# Patient Record
Sex: Female | Born: 1980 | Race: White | Hispanic: No | Marital: Married | Smoking: Former smoker
Health system: Southern US, Community
[De-identification: ages and names within clinical notes are randomized; demographics above are authoritative.]

## PROBLEM LIST (undated history)

## (undated) DIAGNOSIS — B009 Herpesviral infection, unspecified: Secondary | ICD-10-CM

## (undated) DIAGNOSIS — K219 Gastro-esophageal reflux disease without esophagitis: Secondary | ICD-10-CM

## (undated) DIAGNOSIS — O139 Gestational [pregnancy-induced] hypertension without significant proteinuria, unspecified trimester: Secondary | ICD-10-CM

## (undated) HISTORY — PX: COLONOSCOPY: SHX174

## (undated) HISTORY — PX: OTHER SURGICAL HISTORY: SHX169

## (undated) HISTORY — PX: TONSILLECTOMY: SUR1361

## (undated) HISTORY — PX: APPENDECTOMY: SHX54

---

## 1999-04-25 ENCOUNTER — Emergency Department (HOSPITAL_COMMUNITY): Admission: EM | Admit: 1999-04-25 | Discharge: 1999-04-25 | Payer: Self-pay | Admitting: *Deleted

## 1999-05-25 ENCOUNTER — Other Ambulatory Visit: Admission: RE | Admit: 1999-05-25 | Discharge: 1999-05-25 | Payer: Self-pay | Admitting: Obstetrics and Gynecology

## 2000-09-29 ENCOUNTER — Emergency Department (HOSPITAL_COMMUNITY): Admission: EM | Admit: 2000-09-29 | Discharge: 2000-09-29 | Payer: Self-pay | Admitting: Emergency Medicine

## 2000-11-11 ENCOUNTER — Other Ambulatory Visit: Admission: RE | Admit: 2000-11-11 | Discharge: 2000-11-11 | Payer: Self-pay | Admitting: Obstetrics and Gynecology

## 2001-02-08 ENCOUNTER — Emergency Department (HOSPITAL_COMMUNITY): Admission: EM | Admit: 2001-02-08 | Discharge: 2001-02-08 | Payer: Self-pay | Admitting: Emergency Medicine

## 2001-02-08 ENCOUNTER — Encounter: Payer: Self-pay | Admitting: Emergency Medicine

## 2001-10-22 ENCOUNTER — Emergency Department (HOSPITAL_COMMUNITY): Admission: EM | Admit: 2001-10-22 | Discharge: 2001-10-23 | Payer: Self-pay | Admitting: *Deleted

## 2001-11-18 ENCOUNTER — Other Ambulatory Visit: Admission: RE | Admit: 2001-11-18 | Discharge: 2001-11-18 | Payer: Self-pay | Admitting: Obstetrics and Gynecology

## 2002-07-16 ENCOUNTER — Other Ambulatory Visit: Admission: RE | Admit: 2002-07-16 | Discharge: 2002-07-16 | Payer: Self-pay | Admitting: Obstetrics and Gynecology

## 2002-11-05 ENCOUNTER — Inpatient Hospital Stay (HOSPITAL_COMMUNITY): Admission: AD | Admit: 2002-11-05 | Discharge: 2002-11-05 | Payer: Self-pay | Admitting: Obstetrics and Gynecology

## 2002-11-06 ENCOUNTER — Encounter: Payer: Self-pay | Admitting: Obstetrics and Gynecology

## 2003-02-07 ENCOUNTER — Inpatient Hospital Stay (HOSPITAL_COMMUNITY): Admission: AD | Admit: 2003-02-07 | Discharge: 2003-02-07 | Payer: Self-pay | Admitting: Obstetrics & Gynecology

## 2003-02-15 ENCOUNTER — Inpatient Hospital Stay (HOSPITAL_COMMUNITY): Admission: AD | Admit: 2003-02-15 | Discharge: 2003-02-18 | Payer: Self-pay | Admitting: Obstetrics and Gynecology

## 2003-02-16 ENCOUNTER — Encounter (INDEPENDENT_AMBULATORY_CARE_PROVIDER_SITE_OTHER): Payer: Self-pay | Admitting: Specialist

## 2003-02-25 ENCOUNTER — Ambulatory Visit (HOSPITAL_COMMUNITY): Admission: RE | Admit: 2003-02-25 | Discharge: 2003-02-25 | Payer: Self-pay | Admitting: Obstetrics & Gynecology

## 2003-03-22 ENCOUNTER — Other Ambulatory Visit: Admission: RE | Admit: 2003-03-22 | Discharge: 2003-03-22 | Payer: Self-pay | Admitting: Obstetrics and Gynecology

## 2007-01-08 ENCOUNTER — Emergency Department (HOSPITAL_COMMUNITY): Admission: EM | Admit: 2007-01-08 | Discharge: 2007-01-08 | Payer: Self-pay | Admitting: *Deleted

## 2009-10-18 ENCOUNTER — Encounter: Admission: RE | Admit: 2009-10-18 | Discharge: 2009-10-18 | Payer: Self-pay | Admitting: *Deleted

## 2009-11-22 ENCOUNTER — Ambulatory Visit (HOSPITAL_COMMUNITY): Admission: RE | Admit: 2009-11-22 | Discharge: 2009-11-22 | Payer: Self-pay | Admitting: Obstetrics and Gynecology

## 2010-05-17 ENCOUNTER — Emergency Department (HOSPITAL_COMMUNITY): Admission: EM | Admit: 2010-05-17 | Discharge: 2010-05-17 | Payer: Self-pay | Admitting: Family Medicine

## 2010-06-07 IMAGING — RF DG HYSTEROGRAM
6 series · 6 of 6 positions shown · IV contrast (omnipaque)
Comparison: none

CLINICAL DATA: Infertility

HYSTEROSALPINGOGRAM
TECHNIQUE: Following cleansing of the cervix and vagina with
Betadine solution, a hysterosalpingogram was performed using a 5-
French hysterosalpingogram catheter and Omnipaque 300 contrast.
The patient tolerated the exam without difficulty.
Fluoroscopy time:  0.9 minutes

[Series 1: run · 1 of 1 slices shown (1 of 6)]
[im 1/1]
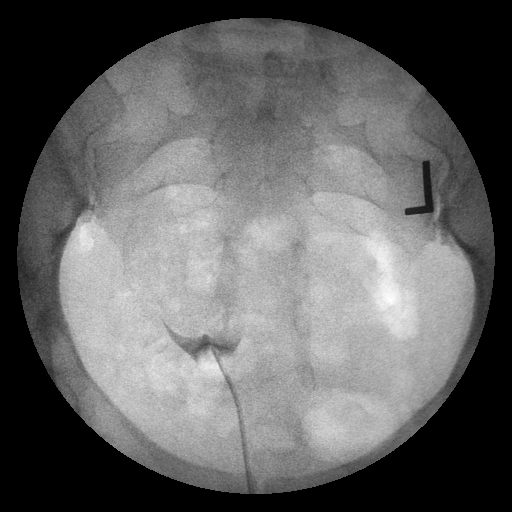

[Series 2: run · 1 of 1 slices shown (2 of 6)]
[im 1/1]
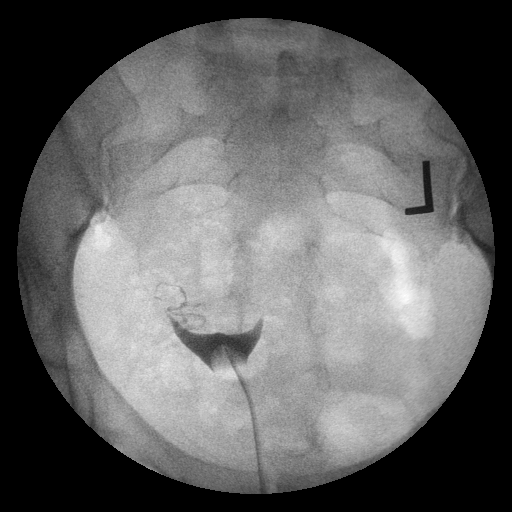

[Series 3: run · 1 of 1 slices shown (3 of 6)]
[im 1/1]
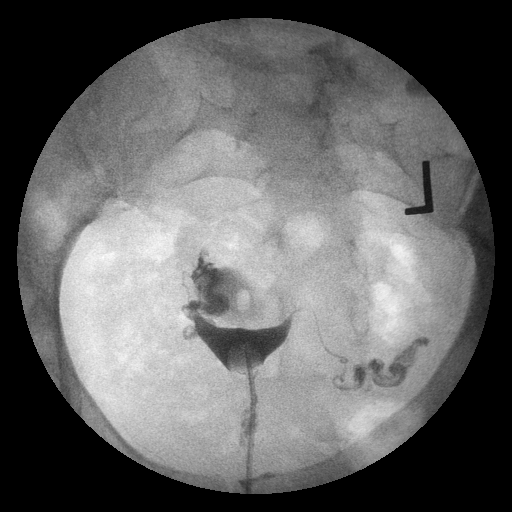

[Series 4: run · 1 of 1 slices shown (4 of 6)]
[im 1/1]
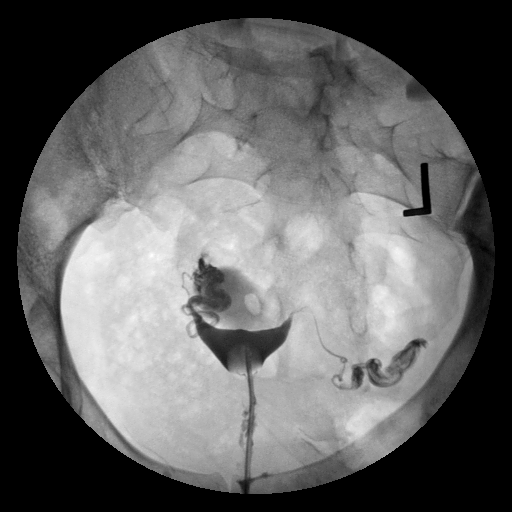

[Series 5: run · 1 of 1 slices shown (5 of 6)]
[im 1/1]
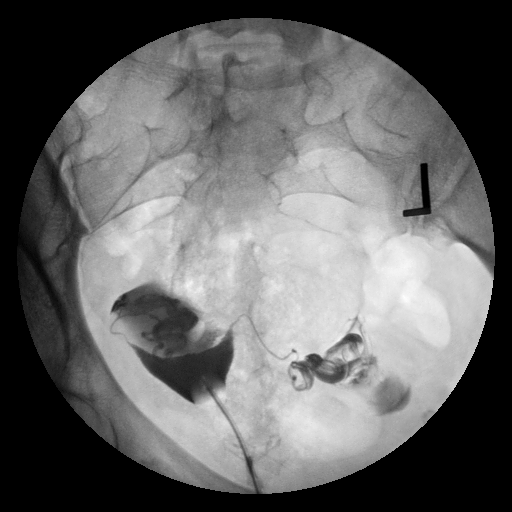

[Series 6: run · 1 of 1 slices shown (6 of 6)]
[im 1/1]
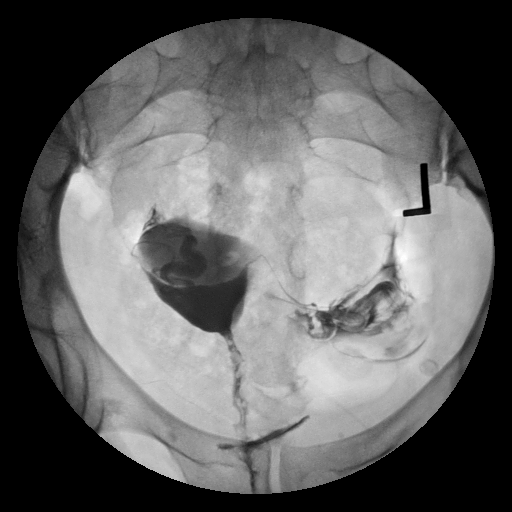

[6 of 6 positions shown; findings below may reference images not displayed]

FINDINGS: The endometrial cavity of the uterus is normal in contour
and appearance.

Contrast filling of both fallopian tubes is seen, and both tubes
are normal in appearance.  Intraperitoneal spill of contrast from
both fallopian tubes is demonstrated.
IMPRESSION: Normal study.  Fallopian tubes are patent bilaterally.

## 2010-07-22 NOTE — L&D Delivery Note (Signed)
Delivery Note At 7:10 AM a viable and healthy female was delivered via Vaginal, Spontaneous Delivery (Presentation: ; Occiput Anterior).  APGAR: 8, 9; weight 7 lb 4.6 oz (3305 g).   Placenta status: Intact, Spontaneous. To path Cord: 2 vessels with the following complications:loose CAN x 1 None.  Cord pH: none Uterine atony mgmt per cytotec per rectum  Anesthesia: Other  Episiotomy: None Lacerations: None Suture Repair: none Est. Blood Loss (mL):   Mom to postpartum.  Baby to nursery-stable.  Welford Christmas A 04/28/2011, 7:52 AM

## 2010-10-03 LAB — POCT URINALYSIS DIPSTICK
Protein, ur: 30 mg/dL — AB
Specific Gravity, Urine: <=1.005 (ref 1.005–1.030)
Urobilinogen, UA: 0.2 mg/dL (ref 0.0–1.0)
pH: 6.5 (ref 5.0–8.0)

## 2010-10-03 LAB — POCT PREGNANCY, URINE: Preg Test, Ur: NEGATIVE

## 2010-10-09 ENCOUNTER — Other Ambulatory Visit: Payer: Self-pay | Admitting: Obstetrics and Gynecology

## 2010-12-07 NOTE — Consult Note (Signed)
   NAME:  Rhonda Sutton, Rhonda Sutton                  ACCOUNT NO.:  [id number]   MEDICAL RECORD NO.:  [id number]                   PATIENT TYPE:  MAT   LOCATION:  [facility]                                 FACILITY:  [facility]   PHYSICIAN:  Lenoard Aden, M.D.             DATE OF BIRTH:  1980-12-27   DATE OF CONSULTATION:  11/05/2002  DATE OF DISCHARGE:  11/05/2002                                   CONSULTATION   CHIEF COMPLAINT:  Was bleeding post intercourse.   HISTORY OF PRESENT ILLNESS:  Patient is a 30 year old white female, G1, P0,  with previously known placenta previa who presents with vaginal bleeding.   PAST MEDICAL HISTORY:  Noncontributory.   OBSTETRIC HISTORY:  Noncontributory.   FAMILY HISTORY:  Noncontributory.   PHYSICAL EXAMINATION:  GENERAL:  She is an anxious-appearing white female in  no apparent distress.  HEENT:  Normal.  LUNGS:  Clear.  HEART:  Regular rate and rhythm.  ABDOMEN:  Soft, gravid, nontender.  PELVIC EXAM:  The speculum exam reveals no evidence of active bleeding.  NEUROLOGIC EXAM:  Nonfocal.   LABORATORY DATA:  Ultrasound consistent with marginal placenta previa,  normal fluid, and normal translabial cervical length.   IMPRESSION:  Second-trimester bleeding, questionably postcoital in nature.   PLAN:  Follow up in the office in one week, and bleeding precautions given,  pelvic rest discussed.                                               Lenoard Aden, M.D.    RJT/MEDQ  D:  11/14/2002  T:  11/14/2002  Job:  [id number]

## 2011-02-20 ENCOUNTER — Inpatient Hospital Stay (HOSPITAL_COMMUNITY): Admission: AD | Admit: 2011-02-20 | Payer: Self-pay | Source: Ambulatory Visit | Admitting: Obstetrics and Gynecology

## 2011-04-25 ENCOUNTER — Encounter (HOSPITAL_BASED_OUTPATIENT_CLINIC_OR_DEPARTMENT_OTHER): Payer: Commercial Managed Care - PPO | Admitting: Oncology

## 2011-04-25 ENCOUNTER — Other Ambulatory Visit: Payer: Self-pay | Admitting: Oncology

## 2011-04-25 ENCOUNTER — Encounter (HOSPITAL_COMMUNITY): Payer: Self-pay | Admitting: Obstetrics and Gynecology

## 2011-04-25 ENCOUNTER — Ambulatory Visit (HOSPITAL_COMMUNITY)
Admission: AD | Admit: 2011-04-25 | Discharge: 2011-04-26 | Disposition: A | Discharge status: Home / Self Care | Payer: Commercial Managed Care - PPO | Source: Ambulatory Visit | Attending: Oncology | Admitting: Oncology

## 2011-04-25 ENCOUNTER — Encounter: Payer: Self-pay | Admitting: Oncology

## 2011-04-25 DIAGNOSIS — O99119 Other diseases of the blood and blood-forming organs and certain disorders involving the immune mechanism complicating pregnancy, unspecified trimester: Principal | ICD-10-CM | POA: Insufficient documentation

## 2011-04-25 DIAGNOSIS — K219 Gastro-esophageal reflux disease without esophagitis: Secondary | ICD-10-CM | POA: Insufficient documentation

## 2011-04-25 DIAGNOSIS — O139 Gestational [pregnancy-induced] hypertension without significant proteinuria, unspecified trimester: Secondary | ICD-10-CM | POA: Insufficient documentation

## 2011-04-25 DIAGNOSIS — D689 Coagulation defect, unspecified: Secondary | ICD-10-CM

## 2011-04-25 DIAGNOSIS — D696 Thrombocytopenia, unspecified: Secondary | ICD-10-CM | POA: Insufficient documentation

## 2011-04-25 DIAGNOSIS — D6959 Other secondary thrombocytopenia: Secondary | ICD-10-CM

## 2011-04-25 HISTORY — DX: Gastro-esophageal reflux disease without esophagitis: K21.9

## 2011-04-25 HISTORY — DX: Herpesviral infection, unspecified: B00.9

## 2011-04-25 LAB — CBC WITH DIFFERENTIAL/PLATELET
BASO%: 0.3 % (ref 0.0–2.0)
Basophils Absolute: 0 10*3/uL (ref 0.0–0.1)
EOS%: 0.3 % (ref 0.0–7.0)
HCT: 36.8 % (ref 34.8–46.6)
LYMPH%: 23.1 % (ref 14.0–49.7)
MCH: 33.7 pg (ref 25.1–34.0)
MCHC: 35.4 g/dL (ref 31.5–36.0)
MCV: 95.3 fL (ref 79.5–101.0)
MONO%: 5.1 % (ref 0.0–14.0)
NEUT%: 71.2 % (ref 38.4–76.8)
Platelets: 87 10*3/uL — ABNORMAL LOW (ref 145–400)
lymph#: 1.7 10*3/uL (ref 0.9–3.3)

## 2011-04-25 LAB — COMPREHENSIVE METABOLIC PANEL
ALT: 30 U/L (ref 0–35)
AST: 33 U/L (ref 0–37)
Alkaline Phosphatase: 166 U/L — ABNORMAL HIGH (ref 39–117)
BUN: 11 mg/dL (ref 6–23)
Creatinine, Ser: 0.59 mg/dL (ref 0.50–1.10)
Total Bilirubin: 0.1 mg/dL — ABNORMAL LOW (ref 0.3–1.2)

## 2011-04-25 MED ORDER — DEXTROSE 5 % IV SOLN
INTRAVENOUS | Status: DC
  Administered 2011-04-25: 20:00:00 via INTRAVENOUS

## 2011-04-25 MED ORDER — METHYLPREDNISOLONE SODIUM SUCC 40 MG IJ SOLR
20.0000 mg | Freq: Once | INTRAMUSCULAR | Status: AC
  Administered 2011-04-25: 20 mg via INTRAVENOUS
  Filled 2011-04-25: qty 0.5

## 2011-04-25 MED ORDER — SODIUM CHLORIDE 0.9 % IV SOLN: 25.0000 mg | Freq: Once | INTRAVENOUS | Status: DC

## 2011-04-25 MED ORDER — SODIUM CHLORIDE 0.9 % IV SOLN: 25.0000 mg/h | Freq: Once | INTRAVENOUS | Status: DC

## 2011-04-25 MED ORDER — IMMUNE GLOBULIN (HUMAN) 20 GM/200ML IV SOLN
80.0000 g | Freq: Once | INTRAVENOUS | Status: AC
  Administered 2011-04-25: 40 g via INTRAVENOUS
  Filled 2011-04-25: qty 800

## 2011-04-25 MED ORDER — MEPERIDINE HCL 25 MG/ML IJ SOLN
25.0000 mg | Freq: Once | INTRAMUSCULAR | Status: AC
  Administered 2011-04-25: 25 mg via INTRAVENOUS
  Filled 2011-04-25: qty 1

## 2011-04-25 MED ORDER — DIPHENHYDRAMINE HCL 25 MG PO CAPS
25.0000 mg | ORAL_CAPSULE | Freq: Once | ORAL | Status: AC
  Administered 2011-04-25: 25 mg via ORAL
  Filled 2011-04-25: qty 1

## 2011-04-25 MED ORDER — ACETAMINOPHEN 325 MG PO TABS
650.0000 mg | ORAL_TABLET | Freq: Once | ORAL | Status: AC
  Administered 2011-04-25: 650 mg via ORAL
  Filled 2011-04-25: qty 2

## 2011-04-25 NOTE — Progress Notes (Deleted)
2021 - IVIG started

## 2011-04-25 NOTE — H&P (Signed)
Rhonda Sutton is a 30 y.o. female G2P1001 presenting for IV IG 2nd to plt count of 88,000. PIH labs normal.(office) Prev preg complicated by gestational thrombocytopenia.  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 Family History: family history is not on file. Social History:  does not have a smoking history on file. She does not have any smokeless tobacco history on file. Her alcohol and drug histories not on file.  ROS: denies h/a, visual change or epigastric pain  Negative General ROS: negative Genito-Urinary ROS: negative   PE: WDWN WF in NAD VS: BP 147/91 P103 afebrile Skin: no lesion HEENT: anicteric sclera, pink conjunctiva, oropharynx neg Cor: RRR w/o m Lungs clear to A Abd: gravid nontender Pelvic deferred  30 y.o.   Prenatal labs: ABO, Rh:  O positive Antibody:  neg Rubella:  Immune RPR:   NR HBsAg:   neg HIV:   neg GBS:   neg  Assessment/Plan: Gestational thrombocytopenia IUP @ 37 wk.  P) IV IG per Rhonda Sutton. Cont fetal monitoring 3)d/c home after infusion. Repeat plt count next Tuesday.  f/u heme/onc Friday  Rhonda Sutton A 04/25/2011, 6:39 PM

## 2011-04-25 NOTE — Progress Notes (Addendum)
Pt. Resting comfortably in bed. 20g PIV started in left FA and D5W infusing. Husband at bedside. Pt. Pre-medicated as ordered with p.o. Tylenol and Benadryl.  2021 - IVIG started at 50mg /kg/hr (36.5cc/hr) 2037 - IVIG increased to 100mg /kg/hr ( 73cc/hr) 2053 - IVIG increased to 200mg /kg/hr (146cc/hr) 2110 - IVIG increased to 400mg /kg/hr (292cc/hr)  Pt. Tolerated all titrations well, no adverse reactions, VSS and pt. Denies any pain or discomfort at this time. Mild HA that she arrived with (states she has "had for days") remains the same and has not increased intensity or location

## 2011-04-25 NOTE — Progress Notes (Signed)
2150 - Pt. Began to "shake" Pt. And S.O. provided explanation about "rigors" and IVIG infusion decreased by half to 200mg /kg/hr (146cc/hr) as per protocol. Rigors continued with no improvement, so within 2-15mins Infusion was stopped and Dr. Arline Asp (on call for Dr. Park Breed) was notified and orders received for Demerol 25mg  IV x1 and Solumedrol 20mg  IV x 1 and then if rigors resolve attempt to transfuse remainder of dose/bottle currently infusing (2 of 4), approx. 10g at reduced rate if pt. Tolerates.  Dr. Cherly Hensen then notified of status of pt. and orders received by Dr. Arline Asp and she agreed with plan of care. Remaining 40g of IVIG ( half of the original ordered dose) will be on hold until a.m.  Dr. Cherly Hensen states she will discuss continued plan of care with Dr. Park Breed in the morning. Pt. Will remain in AICU and be monitored throughout the night.  2310 - IVIG restarted at 50mg /kg/hr (36.5 cc/hr) 2340 - pt. Tolerating well and rate increased to 100mg /kg/hr (73cc/hr)

## 2011-04-26 ENCOUNTER — Encounter (HOSPITAL_COMMUNITY): Payer: Self-pay | Admitting: *Deleted

## 2011-04-26 MED ORDER — IMMUNE GLOBULIN (HUMAN) 20 GM/200ML IV SOLN
40.0000 g | Freq: Once | INTRAVENOUS | Status: AC
  Administered 2011-04-26: 40 g via INTRAVENOUS
  Administered 2011-04-26: 20 g via INTRAVENOUS
  Filled 2011-04-26: qty 400

## 2011-04-26 MED ORDER — ACETAMINOPHEN 325 MG PO TABS
650.0000 mg | ORAL_TABLET | Freq: Once | ORAL | Status: AC
  Administered 2011-04-26: 650 mg via ORAL
  Filled 2011-04-26: qty 2

## 2011-04-26 MED ORDER — DIPHENHYDRAMINE HCL 50 MG/ML IJ SOLN
50.0000 mg | Freq: Once | INTRAMUSCULAR | Status: AC
  Administered 2011-04-26: 50 mg via INTRAVENOUS
  Filled 2011-04-26: qty 1

## 2011-04-26 MED ORDER — METHYLPREDNISOLONE SODIUM SUCC 125 MG IJ SOLR
125.0000 mg | Freq: Once | INTRAMUSCULAR | Status: AC
  Administered 2011-04-26: 125 mg via INTRAVENOUS
  Filled 2011-04-26: qty 2

## 2011-04-26 NOTE — Progress Notes (Addendum)
Spoke with Dr. Welton Flakes this am 0825. Orders received to restart IVIG to complete the transfusion that was stopped last evening as a result of reacting to transfusion.  Pt. received 40grams last evening and is to received an additional 40grams this am. Pre-medication given per orders. Both pt and husband verbalized understanding of plan of care.Tia Alert A  0920-20gms IVIG hung and started at 36.5cc/hr 0935-rarte increased to 73 cc/hr-pt. Tolerating well 0950-rate increased to 146cc/hr-continues to tolerate well. Tia Alert A   1050-20gms IVIG hung (4th and final dose)-keeping at 146 cc/hr-pt without complaints Tia Alert A

## 2011-04-26 NOTE — Progress Notes (Signed)
  S: feels well. Had ctx last night which was not perceived by pt (+) FM Pt had reaction to IVIG last night  O: VSS BP 128/92 Abd: soft gravid nontender Pelvic deferred Extr: (-)edema  Tracing reactive baseline 140 (-)ctx  PIH labs nl in office   A/P Gest thrombocytopenia  PIH IUP # 37 + wk gestation  P) cont IV IG per Dr. Welton Flakes  D/c home after dosings. Repeat plt ct Tues( sched) Labor prec Ob appt next Fri along w/ heme/onc appt

## 2011-04-26 NOTE — Progress Notes (Signed)
1220-IVIG infusion complete-VSS. Pt without complaints. Tia Alert A

## 2011-04-28 ENCOUNTER — Other Ambulatory Visit: Payer: Self-pay | Admitting: Obstetrics and Gynecology

## 2011-04-28 ENCOUNTER — Encounter (HOSPITAL_COMMUNITY): Payer: Self-pay | Admitting: *Deleted

## 2011-04-28 ENCOUNTER — Inpatient Hospital Stay (HOSPITAL_COMMUNITY)
Admission: AD | Admit: 2011-04-28 | Discharge: 2011-05-01 | DRG: 774 | Disposition: A | Discharge status: Home / Self Care | Payer: Commercial Managed Care - PPO | Source: Ambulatory Visit | Attending: Obstetrics & Gynecology | Admitting: Obstetrics & Gynecology

## 2011-04-28 DIAGNOSIS — O139 Gestational [pregnancy-induced] hypertension without significant proteinuria, unspecified trimester: Secondary | ICD-10-CM | POA: Diagnosis present

## 2011-04-28 DIAGNOSIS — O1424 HELLP syndrome, complicating childbirth: Secondary | ICD-10-CM

## 2011-04-28 DIAGNOSIS — D689 Coagulation defect, unspecified: Principal | ICD-10-CM | POA: Diagnosis present

## 2011-04-28 DIAGNOSIS — D696 Thrombocytopenia, unspecified: Secondary | ICD-10-CM | POA: Diagnosis present

## 2011-04-28 HISTORY — DX: Gestational (pregnancy-induced) hypertension without significant proteinuria, unspecified trimester: O13.9

## 2011-04-28 LAB — HIV ANTIBODY (ROUTINE TESTING W REFLEX): HIV: NONREACTIVE

## 2011-04-28 LAB — ABO/RH: ABO/RH(D): O POS

## 2011-04-28 LAB — CBC
MCHC: 34 g/dL (ref 30.0–36.0)
MCV: 95.1 fL (ref 78.0–100.0)
Platelets: 82 10*3/uL — ABNORMAL LOW (ref 150–400)
RDW: 13.5 % (ref 11.5–15.5)
WBC: 7.3 10*3/uL (ref 4.0–10.5)

## 2011-04-28 LAB — POCT FERN TEST: Fern Test: POSITIVE

## 2011-04-28 LAB — HEPATITIS B SURFACE ANTIGEN: Hepatitis B Surface Ag: NEGATIVE

## 2011-04-28 MED ORDER — ACYCLOVIR 200 MG PO CAPS
400.0000 mg | ORAL_CAPSULE | Freq: Two times a day (BID) | ORAL | Status: DC
  Administered 2011-04-28 (×2): 400 mg via ORAL
  Filled 2011-04-28 (×3): qty 2

## 2011-04-28 MED ORDER — DIBUCAINE 1 % RE OINT: 1.0000 "application " | TOPICAL_OINTMENT | RECTAL | Status: DC | PRN

## 2011-04-28 MED ORDER — NALOXONE HCL 0.4 MG/ML IJ SOLN: 0.4000 mg | INTRAMUSCULAR | Status: DC | PRN

## 2011-04-28 MED ORDER — BENZOCAINE-MENTHOL 20-0.5 % EX AERO
INHALATION_SPRAY | CUTANEOUS | Status: AC
  Administered 2011-04-28: 1 via TOPICAL
  Filled 2011-04-28: qty 56

## 2011-04-28 MED ORDER — LACTATED RINGERS IV SOLN: INTRAVENOUS | Status: DC

## 2011-04-28 MED ORDER — OXYCODONE-ACETAMINOPHEN 5-325 MG PO TABS
2.0000 | ORAL_TABLET | ORAL | Status: DC | PRN
  Administered 2011-04-28: 1 via ORAL
  Filled 2011-04-28: qty 1

## 2011-04-28 MED ORDER — FENTANYL 10 MCG/ML IV SOLN
INTRAVENOUS | Status: DC
  Administered 2011-04-28: 20 ug via INTRAVENOUS
  Filled 2011-04-28: qty 50

## 2011-04-28 MED ORDER — OXYTOCIN BOLUS FROM INFUSION
500.0000 mL | Freq: Once | INTRAVENOUS | Status: DC
  Filled 2011-04-28: qty 500
  Filled 2011-04-28: qty 1000

## 2011-04-28 MED ORDER — DIPHENHYDRAMINE HCL 25 MG PO CAPS: 25.0000 mg | ORAL_CAPSULE | Freq: Four times a day (QID) | ORAL | Status: DC | PRN

## 2011-04-28 MED ORDER — OXYTOCIN 10 UNIT/ML IJ SOLN
40.0000 [IU] | INTRAVENOUS | Status: DC
  Administered 2011-04-28: 40 [IU] via INTRAVENOUS
  Filled 2011-04-28: qty 4

## 2011-04-28 MED ORDER — ZOLPIDEM TARTRATE 5 MG PO TABS: 5.0000 mg | ORAL_TABLET | Freq: Every evening | ORAL | Status: DC | PRN

## 2011-04-28 MED ORDER — PANTOPRAZOLE SODIUM 40 MG PO TBEC
40.0000 mg | DELAYED_RELEASE_TABLET | Freq: Every day | ORAL | Status: DC
  Administered 2011-04-28 – 2011-04-30 (×3): 40 mg via ORAL
  Filled 2011-04-28 (×4): qty 1

## 2011-04-28 MED ORDER — ACETAMINOPHEN 325 MG PO TABS: 650.0000 mg | ORAL_TABLET | ORAL | Status: DC | PRN

## 2011-04-28 MED ORDER — ONDANSETRON HCL 4 MG PO TABS: 4.0000 mg | ORAL_TABLET | ORAL | Status: DC | PRN

## 2011-04-28 MED ORDER — FERROUS SULFATE 325 (65 FE) MG PO TABS
325.0000 mg | ORAL_TABLET | Freq: Two times a day (BID) | ORAL | Status: DC
  Administered 2011-04-28 – 2011-05-01 (×3): 325 mg via ORAL
  Filled 2011-04-28 (×3): qty 1

## 2011-04-28 MED ORDER — LIDOCAINE HCL (PF) 1 % IJ SOLN
30.0000 mL | INTRAMUSCULAR | Status: DC | PRN
  Filled 2011-04-28 (×2): qty 30

## 2011-04-28 MED ORDER — DIPHENHYDRAMINE HCL 50 MG/ML IJ SOLN: 12.5000 mg | Freq: Four times a day (QID) | INTRAMUSCULAR | Status: DC | PRN

## 2011-04-28 MED ORDER — ACYCLOVIR 800 MG PO TABS
400.0000 mg | ORAL_TABLET | Freq: Two times a day (BID) | ORAL | Status: DC
  Filled 2011-04-28 (×3): qty 1

## 2011-04-28 MED ORDER — OXYCODONE-ACETAMINOPHEN 5-325 MG PO TABS
1.0000 | ORAL_TABLET | ORAL | Status: DC | PRN
Start: 2011-04-28 — End: 2011-05-01
  Administered 2011-04-28 – 2011-04-30 (×3): 1 via ORAL
  Filled 2011-04-28 (×3): qty 1

## 2011-04-28 MED ORDER — PRENATAL PLUS 27-1 MG PO TABS
1.0000 | ORAL_TABLET | Freq: Every day | ORAL | Status: DC
  Administered 2011-04-28 – 2011-05-01 (×3): 1 via ORAL
  Filled 2011-04-28 (×3): qty 1

## 2011-04-28 MED ORDER — DIPHENHYDRAMINE HCL 12.5 MG/5ML PO ELIX
12.5000 mg | ORAL_SOLUTION | Freq: Four times a day (QID) | ORAL | Status: DC | PRN
  Filled 2011-04-28: qty 5

## 2011-04-28 MED ORDER — ONDANSETRON HCL 4 MG/2ML IJ SOLN: 4.0000 mg | Freq: Four times a day (QID) | INTRAMUSCULAR | Status: DC | PRN

## 2011-04-28 MED ORDER — BENZOCAINE-MENTHOL 20-0.5 % EX AERO
1.0000 "application " | INHALATION_SPRAY | CUTANEOUS | Status: DC | PRN
  Administered 2011-04-28: 1 via TOPICAL

## 2011-04-28 MED ORDER — OXYTOCIN 20 UNITS IN LACTATED RINGERS INFUSION - SIMPLE: 125.0000 mL/h | Freq: Once | INTRAVENOUS | Status: DC

## 2011-04-28 MED ORDER — CITRIC ACID-SODIUM CITRATE 334-500 MG/5ML PO SOLN: 30.0000 mL | ORAL | Status: DC | PRN

## 2011-04-28 MED ORDER — PREDNISONE 50 MG PO TABS
60.0000 mg | ORAL_TABLET | Freq: Every day | ORAL | Status: DC
  Administered 2011-04-28 – 2011-05-01 (×3): 60 mg via ORAL
  Filled 2011-04-28 (×5): qty 1

## 2011-04-28 MED ORDER — SODIUM CHLORIDE 0.9 % IJ SOLN: 9.0000 mL | INTRAMUSCULAR | Status: DC | PRN

## 2011-04-28 MED ORDER — LABETALOL HCL 5 MG/ML IV SOLN: 20.0000 mg | Freq: Once | INTRAVENOUS | Status: DC

## 2011-04-28 MED ORDER — SENNOSIDES-DOCUSATE SODIUM 8.6-50 MG PO TABS
2.0000 | ORAL_TABLET | Freq: Every day | ORAL | Status: DC
  Administered 2011-04-28 – 2011-04-30 (×3): 2 via ORAL

## 2011-04-28 MED ORDER — IBUPROFEN 600 MG PO TABS: 600.0000 mg | ORAL_TABLET | Freq: Four times a day (QID) | ORAL | Status: DC | PRN

## 2011-04-28 MED ORDER — LACTATED RINGERS IV SOLN: 500.0000 mL | INTRAVENOUS | Status: DC | PRN

## 2011-04-28 MED ORDER — TETANUS-DIPHTH-ACELL PERTUSSIS 5-2.5-18.5 LF-MCG/0.5 IM SUSP
0.5000 mL | Freq: Once | INTRAMUSCULAR | Status: AC
  Administered 2011-04-29: 0.5 mL via INTRAMUSCULAR
  Filled 2011-04-28: qty 0.5

## 2011-04-28 MED ORDER — SIMETHICONE 80 MG PO CHEW: 80.0000 mg | CHEWABLE_TABLET | ORAL | Status: DC | PRN

## 2011-04-28 MED ORDER — LANOLIN HYDROUS EX OINT: TOPICAL_OINTMENT | CUTANEOUS | Status: DC | PRN

## 2011-04-28 MED ORDER — ONDANSETRON HCL 4 MG/2ML IJ SOLN: 4.0000 mg | INTRAMUSCULAR | Status: DC | PRN

## 2011-04-28 MED ORDER — WITCH HAZEL-GLYCERIN EX PADS: 1.0000 "application " | MEDICATED_PAD | CUTANEOUS | Status: DC | PRN

## 2011-04-28 MED ORDER — FLEET ENEMA 7-19 GM/118ML RE ENEM: 1.0000 | ENEMA | RECTAL | Status: DC | PRN

## 2011-04-28 MED ORDER — MISOPROSTOL 200 MCG PO TABS
ORAL_TABLET | ORAL | Status: AC
  Administered 2011-04-28: 800 ug
  Filled 2011-04-28: qty 5

## 2011-04-28 NOTE — Progress Notes (Signed)
SVD viable female infant.

## 2011-04-28 NOTE — H&P (Signed)
Rhonda Sutton is a 30 y.o. female presenting now @ 37 6/7  for admission 2nd to SROM clear fluid @ 2 am. [facility] complicated by gestational thrombocytopenia for which the pt was admitted for IV IG and steroid. Pt also is on HSV suppressive therapy w/o recent outbreak. Hx PPH. PIH w/ normal PIH labs last week  History OB History    Grav Para Term Preterm Abortions TAB SAB Ect Mult Living   2 1 1       1      Past Medical History  Diagnosis Date  . Gastroesophageal reflux   . Recurrent HSV (herpes simplex virus)   . Ulcerative colitis   . Migraine   . Pregnancy induced hypertension    Past Surgical History  Procedure Date  . None   . Tonsillectomy    Family History: family history is not on file. Social History:  reports that she quit smoking about 4 years ago. Her smoking use included Cigarettes. She smoked .5 packs per day. She has never used smokeless tobacco. She reports that she does not drink alcohol or use illicit drugs.  ROS neg Dilation: 2 Effacement (%): 80 Station: -2 Exam by:: DuPont, RN  Blood pressure 128/89, pulse 73, temperature 98 F (36.7 C), temperature source Oral, resp. rate 22, height 5\' 7"  (1.702 m), weight 162 lb 12.8 oz (73.846 kg), last menstrual period 07/27/2010, SpO2 98.00%. Maternal Exam:  Uterine Assessment: Contraction strength is moderate.  Contraction frequency is irregular.   Abdomen: Patient reports no abdominal tenderness. Estimated fetal weight is 7 1/2 lb.   Fetal presentation: vertex  Introitus: Normal vulva. Normal vagina.  Ferning test: positive.  Amniotic fluid character: clear.  Pelvis: adequate for delivery.      Physical Exam  Constitutional: She is oriented to person, place, and time. She appears well-developed and well-nourished.  Neck: Neck supple.  Cardiovascular: Regular rhythm.   Respiratory: Breath sounds normal.  GI: Soft.  Neurological: She is alert and oriented to person, place, and time.    Prenatal  labs: ABO, Rh: O/Positive/-- (10/07 0000) Antibody: Negative (10/07 0000) Rubella: Immune (10/07 0000) RPR: Nonreactive (10/07 0000)  HBsAg: Negative (10/07 0000)  HIV: Non-reactive (10/07 0000)  GBS: Negative (10/07 0000)   Assessment/Plan:SROM Gestational thrombocytopenia IUP @ 37 6/7wk PIH Hx PPH  P) Admit. Routine labs. Pitocin augmentation prn. Fentanyl PCA if unable to receive epidural. cont prednisone for low plt( taper per heme/onc).  BP med prn. Aggressive mgmt of post placenta delivery 2nd to prior hx    Rhonda Sutton A 04/28/2011, 7:25 AM

## 2011-04-28 NOTE — Progress Notes (Signed)
Leaking fld since 0200-cloudy color. Had been have ctxs but stronger since leaking started. Hx G2P1 at 37.6wks. Low platelets and received IVIG Thurs. Platelets 87 on Thurs before IVIG.

## 2011-04-28 NOTE — Progress Notes (Signed)
Rhonda Sutton is a 30 y.o. G2P2002 at [redacted]w[redacted]d by ultrasound admitted for rupture of membranes  Subjective: c/o rectal pressure. Fentanyl PCA for pain mgmt  Objective: BP 134/93  Pulse 69  Temp(Src) 98 F (36.7 C) (Oral)  Resp 22  Ht 5\' 7"  (1.702 m)  Wt 162 lb 12.8 oz (73.846 kg)  BMI 25.50 kg/m2  SpO2 100%  LMP 07/27/2010  Breastfeeding? Unknown      FHT:  FHR: 130 bpm, variability: moderate,  accelerations:  Present,  decelerations:  Present some variable w/ pushing UC:  Ctx q 2 mins SVE:   Dilation: 10 Effacement (%): 100 Station: +2 Exam by:: DuPont, RN  Labs: Lab Results  Component Value Date   WBC 7.3 04/28/2011   HGB 13.1 04/28/2011   HCT 38.5 04/28/2011   MCV 95.1 04/28/2011   PLT 82* 04/28/2011    Assessment / Plan: Spontaneous labor, progressing normally 2) gestational thrombocytopenia 3) PIH 4) IUP @ 37 6/7 wk  Labor: rapid progression Preeclampsia:  no signs or symptoms of toxicity Fetal Wellbeing:  Category I and Category II Pain Control:  Fentanyl I/D:  n/a Anticipated MOD:  NSVD  Rhonda Sutton A 04/28/2011, 7:45 AM

## 2011-04-28 NOTE — Progress Notes (Signed)
Dr. Cherly Hensen made aware of pt blood pressures. Order for Iv labetalol 20 mg once. No other orders at this time.

## 2011-04-28 NOTE — ED Notes (Signed)
Report called to [facility] RN in [place]. Pt to 163 from Triage

## 2011-04-29 DIAGNOSIS — O1424 HELLP syndrome, complicating childbirth: Secondary | ICD-10-CM | POA: Diagnosis present

## 2011-04-29 LAB — COMPREHENSIVE METABOLIC PANEL
Alkaline Phosphatase: 130 U/L — ABNORMAL HIGH (ref 39–117)
BUN: 10 mg/dL (ref 6–23)
CO2: 27 mEq/L (ref 19–32)
Chloride: 101 mEq/L (ref 96–112)
GFR calc Af Amer: >90 mL/min (ref >90)
Glucose, Bld: 81 mg/dL (ref 70–99)
Sodium: 133 mEq/L — ABNORMAL LOW (ref 135–145)
Total Bilirubin: 0.2 mg/dL — ABNORMAL LOW (ref 0.3–1.2)
Total Protein: 5.7 g/dL — ABNORMAL LOW (ref 6.0–8.3)

## 2011-04-29 LAB — CBC
Hemoglobin: 10.9 g/dL — ABNORMAL LOW (ref 12.0–15.0)
MCH: 32.1 pg (ref 26.0–34.0)
MCHC: 34.3 g/dL (ref 30.0–36.0)
Platelets: 64 10*3/uL — ABNORMAL LOW (ref 150–400)
RBC: 3.4 MIL/uL — ABNORMAL LOW (ref 3.87–5.11)

## 2011-04-29 LAB — URIC ACID: Uric Acid, Serum: 4.8 mg/dL (ref 2.4–7.0)

## 2011-04-29 LAB — MRSA PCR SCREENING: MRSA by PCR: NEGATIVE

## 2011-04-29 MED ORDER — NIFEDIPINE ER 30 MG PO TB24: 30.0000 mg | ORAL_TABLET | Freq: Every day | ORAL | Status: DC

## 2011-04-29 MED ORDER — MAGNESIUM SULFATE 40 G IN LACTATED RINGERS - SIMPLE
1.0000 g/h | INTRAVENOUS | Status: AC
  Administered 2011-04-29 – 2011-04-30 (×2): 2 g/h via INTRAVENOUS
  Filled 2011-04-29 (×2): qty 500

## 2011-04-29 MED ORDER — HYDROCHLOROTHIAZIDE 25 MG PO TABS
25.0000 mg | ORAL_TABLET | Freq: Every day | ORAL | Status: DC
  Administered 2011-04-29: 25 mg via ORAL
  Filled 2011-04-29 (×2): qty 1

## 2011-04-29 MED ORDER — LACTATED RINGERS IV SOLN
INTRAVENOUS | Status: DC
  Administered 2011-04-29 – 2011-04-30 (×2): via INTRAVENOUS

## 2011-04-29 MED ORDER — MAGNESIUM SULFATE BOLUS VIA INFUSION
4.0000 g | Freq: Once | INTRAVENOUS | Status: AC
  Administered 2011-04-29: 4 g via INTRAVENOUS
  Filled 2011-04-29: qty 500

## 2011-04-29 NOTE — Progress Notes (Signed)
1110 Pt rec'd from [facility] via w/c for IV magnesium for HELLP

## 2011-04-29 NOTE — Progress Notes (Signed)
UR chart review completed.  

## 2011-04-29 NOTE — Progress Notes (Signed)
PPD 1 SVD  Thrombocytopenia  S:  Reports feeling ok / headache earlier but resolved with sleep             Tolerating po/ No nausea or vomiting             Bleeding is moderate             Pain controlled withnarcotic analgesics including percocet             Up ad lib / ambulatory  Newborn breast feeding     O:  A & O x 3 NAD             VS: Blood pressure 133/95, pulse 66, temperature 98.5 F (36.9 C), temperature source Oral, resp. rate 18, height 5\' 7"  (1.702 m), weight 73.846 kg (162 lb 12.8 oz), last menstrual period 07/27/2010, SpO2 98.00%, unknown if currently breastfeeding.  LABS: Lab Results  Component Value Date   WBC 9.0 04/29/2011   HGB 10.9* 04/29/2011   HCT 31.8* 04/29/2011   MCV 93.5 04/29/2011   PLT 64* 04/29/2011    Creatnine :  0.6 Uric Acid : 4.8 SGOT : 94 SGPT: 85  Glucose: 82  Thrombocytopenia managed by Dr Welton Flakes.    I&O:   Not done     Lungs: Clear and unlabored  Heart: regular rate and rhythm / no mumurs  Abdomen: soft, non-tender, non-distended              Fundus: firm, non-tender, U-1  Perineum: no edema  Lochia: light  Extremities: 1+ edema, no calf pain or tenderness, DTR 2+   A: PPD # 1             Gestational Thrombocytopenia - Platelets at 45 today / managed by Dr. Welton Flakes  Abnormal liver enzymes / elevated BP              Preeclampsia with HELLP  P:  Routine post partum orders  Consult with DR Wonda Olds                   1) Transfer to AICU                   2) strict I & O with foley                   3) Magnesium sulfate 4gm load then 2 gm / hr                   4)  Procardia 30 mg XL daily with HCTZ 25 mg daily                   5)  call for inpatient consult by Dr Mechele Collin 04/29/2011, 9:19 AM

## 2011-04-29 NOTE — Plan of Care (Signed)
TC to Dr Welton Flakes - spoke with her directly at 1300 / calling for Dr Cherly Hensen  Updated MD with current status -s/p SVD without postpartum hemorrhage. 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 Current platelet count 64 - taking prednisone 60 mg daily / no NSAIDS. Patient transferred to AICU at [facility] for management of HELLP / preeclampsia with magnesium sulfate.  Recommendation: 1) continue prednisone 60 mg until normal platelet count 2) see hematology for taper of prednisone in next 1-2 weeks -AFTER platelet level normal range 3) call if platelet count falls less than 60 or if any further questions or concerns

## 2011-04-30 LAB — COMPREHENSIVE METABOLIC PANEL
ALT: 85 U/L — ABNORMAL HIGH (ref 0–35)
AST: 79 U/L — ABNORMAL HIGH (ref 0–37)
Albumin: 2.4 g/dL — ABNORMAL LOW (ref 3.5–5.2)
Calcium: 6.8 mg/dL — ABNORMAL LOW (ref 8.4–10.5)
Sodium: 133 mEq/L — ABNORMAL LOW (ref 135–145)
Total Protein: 6 g/dL (ref 6.0–8.3)

## 2011-04-30 LAB — CBC
MCH: 31.9 pg (ref 26.0–34.0)
MCHC: 34 g/dL (ref 30.0–36.0)
Platelets: 89 10*3/uL — ABNORMAL LOW (ref 150–400)
RBC: 3.32 MIL/uL — ABNORMAL LOW (ref 3.87–5.11)
RDW: 13.6 % (ref 11.5–15.5)

## 2011-04-30 NOTE — Progress Notes (Signed)
  Post Partum Day #2       AICU   Newborn info:  Rhonda, Sutton [[id number]]  female   / circumcision done  / feeding breast  Subjective:   Pt reports feeling tired and blurry vision, difficulty focusing long distance. On Mag Sulfate for past 20 hours.  / Tolerating po/ Voiding without problems/ No n/v/ Bleeding is light/ Pain controlled withprescription NSAID's including ibuprofen (Motrin) Infant cluster feeding and crying through the night, had only one hour's rest with infant in nursery. Does not want to supplement. Lactation support yesterday for latching issues.        Objective:     VS:   Temp:  [97.5 F (36.4 C)-98.2 F (36.8 C)] 97.8 F (36.6 C) (10/09 0800) Pulse Rate:  [71-108] 72  (10/09 0900) Resp:  [16-20] 18  (10/09 0600) BP: (105-132)/(63-99) 124/87 mmHg (10/09 0900) SpO2:  [96 %-99 %] 97 % (10/09 0900) Weight:  [69.128 kg (152 lb 6.4 oz)] 152 lb 6.4 oz (69.128 kg) (10/08 1107)    Intake/Output Summary (Last 24 hours) at 04/30/11 0936 Last data filed at 04/30/11 0700  Gross per 24 hour  Intake 2064.58 ml  Output   5150 ml  Net -3085.42 ml        Basename 04/30/11 0540 04/29/11 0530  WBC 8.7 9.0  HGB 10.6* 10.9*  HCT 31.2* 31.8*  PLT 89* 64*     Blood type: --/--/O POS (10/07 0414)  Rubella: Immune (10/07 0000)     Physical Exam:   A & O x 3 , teary easily, labile mood   Lungs: CTAB  Heart: RR  Abdomen: soft, non-tender, FF @ U  Perineum: intact, edema none  Lochia: scant  Extremities: neg Homans', edema trace pedal  DTR's +2, no clonus    Assessment/Plan:  PPD # 1 / 30 y.o., [id number] S/P:spontaneous vaginal   Active Problems:  HELLP syndrome, delivered, current hospitalization Thrombocytopenia - Dr. Welton Flakes consulting Platelets improving on steroids - continue current management    Condition improving, BP stable and good diuresis  Increased fatigue with BFing cluster - encouraged breastfeeding now and then supplementing afterwards if  infant still giving hunger cues, decreased stimulation for next couple of hours and rest as much as possible today.  Decrease Mag Sulfate to 1 gm/hr then D/C at 24 hrs  May transfer to [facility] this PM if BP stable  D/C HCTZ   Continue routine post partum orders  Lactation support   Consult with Dr. Cherly Hensen - updated and agrees.         LOS: 2 days   Brenetta Penny, CNM, MSN 04/30/2011, 9:36 AM

## 2011-05-01 LAB — COMPREHENSIVE METABOLIC PANEL
ALT: 108 U/L — ABNORMAL HIGH (ref 0–35)
AST: 97 U/L — ABNORMAL HIGH (ref 0–37)
Alkaline Phosphatase: 113 U/L (ref 39–117)
CO2: 29 mEq/L (ref 19–32)
Calcium: 8 mg/dL — ABNORMAL LOW (ref 8.4–10.5)
Glucose, Bld: 76 mg/dL (ref 70–99)
Potassium: 3.8 mEq/L (ref 3.5–5.1)
Sodium: 136 mEq/L (ref 135–145)
Total Protein: 5.7 g/dL — ABNORMAL LOW (ref 6.0–8.3)

## 2011-05-01 LAB — CBC
Hemoglobin: 10.5 g/dL — ABNORMAL LOW (ref 12.0–15.0)
MCH: 32.4 pg (ref 26.0–34.0)
MCHC: 34.1 g/dL (ref 30.0–36.0)
Platelets: 101 10*3/uL — ABNORMAL LOW (ref 150–400)
RBC: 3.24 MIL/uL — ABNORMAL LOW (ref 3.87–5.11)

## 2011-05-01 NOTE — Discharge Summary (Signed)
Obstetric Discharge Summary Reason for Admission: onset of labor Prenatal Procedures: NST, ultrasound and hematology management of thrombocytopenia Intrapartum Procedures: spontaneous vaginal delivery Postpartum Procedures: Magnesium sulfate prophylaxis x 24 hours Complications-Operative and Postpartum: Elevated blood pressure with decreased platelets & elevated liver enzymes on PPD1 - dx HELLP HGB  Date Value Range Status  04/25/2011 13.0  11.6-15.9 (g/dL) Final     Hemoglobin  Date Value Range Status  05/01/2011 10.5* 12.0-15.0 (g/dL) Final     HCT  Date Value Range Status  05/01/2011 30.8* 36.0-46.0 (%) Final  04/25/2011 36.8  34.8-46.6 (%) Final    Discharge Diagnoses: Term Pregnancy-delivered, Preelampsia and HELLP / thrombocytopenia  Discharge Information: Date: 05/01/2011 Activity: pelvic rest Diet: routine Medications: PNV and Tylenol prn pain / prednisone 60 mg daily /protonix 40 mg daily Condition: stable Instructions: refer to practice specific booklet Discharge to: home Follow-up Information    Follow up with COUSINS,SHERONETTE A, MD. Make an appointment in 6 days.   Contact information:   [address] [place] [address] [phone]        Office visit in 6-7 days for blood pressure check and repeat CBC (platelet check) Referral back to Dr Welton Flakes for prednisone taper after platelet over 150 - anticipate in next 7-10 days  Newborn Data: Live born female  Birth Weight: 7 lb 4.6 oz (3305 g) APGAR: 8, 9  Home with mother.  Rhonda Sutton 05/01/2011, 9:55 AM

## 2011-05-01 NOTE — Progress Notes (Signed)
  PPD 2 SVD  S:  Reports feeling much better / bleeding light with a few small clots after sleeping             Tolerating po/ No nausea or vomiting             Bleeding is light             Pain controlled without medication             Up ad lib / ambulatory  Newborn breast feeding  / Circumcision done   O:  A & O x 3 /  BP range : 103-122/68-74             VS: Blood pressure 104/72, pulse 72, temperature 97.6 F (36.4 C), temperature source Oral, resp. rate 16, height 5\' 7"  (1.702 m), weight 69.128 kg (152 lb 6.4 oz), last menstrual period 07/27/2010, SpO2 98.00%, unknown if currently breastfeeding.  LABS: Lab Results  Component Value Date   WBC 9.1 05/01/2011   HGB 10.5* 05/01/2011   HCT 30.8* 05/01/2011   MCV 95.1 05/01/2011   PLT 101* 05/01/2011     I&O: I/O last 3 completed shifts: In: 1954.8 [P.O.:720; I.V.:1234.8] Out: 2750 [Urine:2750]      Lungs: Clear and unlabored  Heart: regular rate and rhythm / no mumurs  Abdomen: soft, non-tender, non-distended              Fundus: firm, non-tender, U-2  Perineum: no edema  Lochia: light / scant amount on peri-pad                           passed small clot this am after sleeping - no bleeding since  Extremities: no edema, no calf pain or tenderness, negative Homans    A: PPD # 3 SVD / HELLP / thrombocytopenia   Doing well - stable status  P:  Routine post partum orders             Consult with DR Wonda Olds - stable for DC             Discharge home today - OV in 6 days at [facility] for BP and labs / Welton Flakes in 10-14 days  [facility] 05/01/2011, 8:48 AM

## 2011-05-06 ENCOUNTER — Inpatient Hospital Stay (HOSPITAL_COMMUNITY): Admission: RE | Admit: 2011-05-06 | Payer: Self-pay | Source: Ambulatory Visit

## 2011-05-08 LAB — I-STAT 8, (EC8 V) (CONVERTED LAB)
Acid-base deficit: 2
Bicarbonate: 23.1
Operator id: 146091
TCO2: 24
pCO2, Ven: 41.5 — ABNORMAL LOW
pH, Ven: 7.354 — ABNORMAL HIGH

## 2011-05-08 LAB — DIFFERENTIAL
Basophils Absolute: 0
Basophils Relative: 0
Eosinophils Absolute: 0.1
Eosinophils Relative: 1
Monocytes Absolute: 0.5

## 2011-05-08 LAB — CBC
HCT: 42.3
Hemoglobin: 14.6
MCHC: 34.6
Platelets: 225
RDW: 13.1

## 2011-05-08 LAB — POCT I-STAT CREATININE: Operator id: 146091

## 2011-05-10 ENCOUNTER — Encounter (HOSPITAL_BASED_OUTPATIENT_CLINIC_OR_DEPARTMENT_OTHER): Payer: Commercial Managed Care - PPO | Admitting: Oncology

## 2011-05-10 ENCOUNTER — Other Ambulatory Visit: Payer: Self-pay | Admitting: Oncology

## 2011-05-10 DIAGNOSIS — D6959 Other secondary thrombocytopenia: Secondary | ICD-10-CM

## 2011-05-10 LAB — CBC WITH DIFFERENTIAL/PLATELET
Basophils Absolute: 0 10*3/uL (ref 0.0–0.1)
Eosinophils Absolute: 0 10*3/uL (ref 0.0–0.5)
HCT: 38.1 % (ref 34.8–46.6)
HGB: 13 g/dL (ref 11.6–15.9)
MCV: 97.2 fL (ref 79.5–101.0)
MONO%: 6.4 % (ref 0.0–14.0)
NEUT#: 4.7 10*3/uL (ref 1.5–6.5)
NEUT%: 60.2 % (ref 38.4–76.8)
Platelets: 232 10*3/uL (ref 145–400)
RDW: 14.3 % (ref 11.2–14.5)

## 2011-11-08 ENCOUNTER — Telehealth: Payer: Self-pay | Admitting: *Deleted

## 2011-11-08 DIAGNOSIS — D696 Thrombocytopenia, unspecified: Secondary | ICD-10-CM

## 2011-11-08 NOTE — Telephone Encounter (Signed)
Pt called states " I'm feeling like I was prior to the baby. I'm having headaches.  Dr. Welton Flakes had took me down off the prednisone and I had gotten IVIG there before.  Can I make an appt to have my labs checked or see her?"  Pt last seen on 04/24/12 for gestational thrombocytopenia. Rcvd  Prednisone  60mg  daily tapered dose & IVIG at [facility]  Will review with MD

## 2011-11-08 NOTE — Telephone Encounter (Signed)
Called pt to notify per MD she will be seen at next available appt, to expect a call from schedulers.  Pt instructed to call back if she does not receive call by Tues or Wednesday.  Pt will have labs prior to MD visit. Pt verbalized understanding.  Lab orders entered. Onc Tx Sched sent

## 2011-11-08 NOTE — Telephone Encounter (Signed)
Please get her to see me at the next available appointment with CBC/CMET

## 2011-11-11 ENCOUNTER — Encounter: Payer: Self-pay | Admitting: *Deleted

## 2014-05-23 ENCOUNTER — Encounter (HOSPITAL_COMMUNITY): Payer: Self-pay | Admitting: *Deleted

## 2015-08-15 ENCOUNTER — Telehealth: Payer: Self-pay

## 2015-08-15 NOTE — Telephone Encounter (Signed)
Rec'd from Jarold Song forward 17 pages to GI Historical Provider

## 2015-08-25 ENCOUNTER — Telehealth: Payer: Self-pay | Admitting: Gastroenterology

## 2015-08-25 NOTE — Telephone Encounter (Signed)
2/3 Record received and routed to Dr. Lavon Paganini for review.

## 2015-09-27 NOTE — Telephone Encounter (Signed)
Dr. Lavon PaganiniNandigam reviewed records and has accepted patient. Ok to schedule OV. Messages have been left on 09/06/15 and 09/27/15 for patient to return my call. Records will be in "records reviewed" folder.

## 2015-10-27 DIAGNOSIS — J3081 Allergic rhinitis due to animal (cat) (dog) hair and dander: Secondary | ICD-10-CM | POA: Diagnosis not present

## 2015-10-27 DIAGNOSIS — J3089 Other allergic rhinitis: Secondary | ICD-10-CM | POA: Diagnosis not present

## 2015-10-27 DIAGNOSIS — H1045 Other chronic allergic conjunctivitis: Secondary | ICD-10-CM | POA: Diagnosis not present

## 2015-10-27 DIAGNOSIS — J301 Allergic rhinitis due to pollen: Secondary | ICD-10-CM | POA: Diagnosis not present

## 2015-11-02 DIAGNOSIS — J3081 Allergic rhinitis due to animal (cat) (dog) hair and dander: Secondary | ICD-10-CM | POA: Diagnosis not present

## 2015-11-02 DIAGNOSIS — J301 Allergic rhinitis due to pollen: Secondary | ICD-10-CM | POA: Diagnosis not present

## 2015-11-06 DIAGNOSIS — J3089 Other allergic rhinitis: Secondary | ICD-10-CM | POA: Diagnosis not present

## 2016-02-14 DIAGNOSIS — Z1151 Encounter for screening for human papillomavirus (HPV): Secondary | ICD-10-CM | POA: Diagnosis not present

## 2016-02-14 DIAGNOSIS — Z01419 Encounter for gynecological examination (general) (routine) without abnormal findings: Secondary | ICD-10-CM | POA: Diagnosis not present

## 2016-02-14 DIAGNOSIS — Z6821 Body mass index (BMI) 21.0-21.9, adult: Secondary | ICD-10-CM | POA: Diagnosis not present

## 2016-07-08 DIAGNOSIS — N938 Other specified abnormal uterine and vaginal bleeding: Secondary | ICD-10-CM | POA: Diagnosis not present

## 2016-07-08 DIAGNOSIS — N898 Other specified noninflammatory disorders of vagina: Secondary | ICD-10-CM | POA: Diagnosis not present

## 2016-07-08 DIAGNOSIS — R350 Frequency of micturition: Secondary | ICD-10-CM | POA: Diagnosis not present

## 2016-07-08 DIAGNOSIS — R300 Dysuria: Secondary | ICD-10-CM | POA: Diagnosis not present

## 2017-04-10 NOTE — Telephone Encounter (Signed)
Patient never returned phone call to schedule. Records have been shredded.

## 2017-04-15 DIAGNOSIS — Z1151 Encounter for screening for human papillomavirus (HPV): Secondary | ICD-10-CM | POA: Diagnosis not present

## 2017-04-15 DIAGNOSIS — Z6822 Body mass index (BMI) 22.0-22.9, adult: Secondary | ICD-10-CM | POA: Diagnosis not present

## 2017-04-15 DIAGNOSIS — Z01419 Encounter for gynecological examination (general) (routine) without abnormal findings: Secondary | ICD-10-CM | POA: Diagnosis not present

## 2017-05-12 DIAGNOSIS — J029 Acute pharyngitis, unspecified: Secondary | ICD-10-CM | POA: Diagnosis not present

## 2017-07-10 DIAGNOSIS — D225 Melanocytic nevi of trunk: Secondary | ICD-10-CM | POA: Diagnosis not present

## 2017-07-10 DIAGNOSIS — D1801 Hemangioma of skin and subcutaneous tissue: Secondary | ICD-10-CM | POA: Diagnosis not present

## 2017-07-10 DIAGNOSIS — Z872 Personal history of diseases of the skin and subcutaneous tissue: Secondary | ICD-10-CM | POA: Diagnosis not present

## 2017-07-10 DIAGNOSIS — L814 Other melanin hyperpigmentation: Secondary | ICD-10-CM | POA: Diagnosis not present

## 2018-01-14 DIAGNOSIS — D485 Neoplasm of uncertain behavior of skin: Secondary | ICD-10-CM | POA: Diagnosis not present

## 2018-01-14 DIAGNOSIS — D225 Melanocytic nevi of trunk: Secondary | ICD-10-CM | POA: Diagnosis not present

## 2018-04-21 DIAGNOSIS — Z6821 Body mass index (BMI) 21.0-21.9, adult: Secondary | ICD-10-CM | POA: Diagnosis not present

## 2018-04-21 DIAGNOSIS — Z1151 Encounter for screening for human papillomavirus (HPV): Secondary | ICD-10-CM | POA: Diagnosis not present

## 2018-04-21 DIAGNOSIS — Z01419 Encounter for gynecological examination (general) (routine) without abnormal findings: Secondary | ICD-10-CM | POA: Diagnosis not present

## 2018-06-08 DIAGNOSIS — M25511 Pain in right shoulder: Secondary | ICD-10-CM | POA: Diagnosis not present

## 2018-06-11 DIAGNOSIS — M75111 Incomplete rotator cuff tear or rupture of right shoulder, not specified as traumatic: Secondary | ICD-10-CM | POA: Diagnosis not present

## 2018-06-11 DIAGNOSIS — M25511 Pain in right shoulder: Secondary | ICD-10-CM | POA: Diagnosis not present

## 2018-06-11 DIAGNOSIS — S43431A Superior glenoid labrum lesion of right shoulder, initial encounter: Secondary | ICD-10-CM | POA: Diagnosis not present

## 2018-06-11 DIAGNOSIS — M75101 Unspecified rotator cuff tear or rupture of right shoulder, not specified as traumatic: Secondary | ICD-10-CM | POA: Diagnosis not present

## 2018-06-11 DIAGNOSIS — M7581 Other shoulder lesions, right shoulder: Secondary | ICD-10-CM | POA: Diagnosis not present

## 2018-06-17 DIAGNOSIS — M7581 Other shoulder lesions, right shoulder: Secondary | ICD-10-CM | POA: Diagnosis not present

## 2018-06-17 DIAGNOSIS — M25511 Pain in right shoulder: Secondary | ICD-10-CM | POA: Diagnosis not present

## 2018-07-01 DIAGNOSIS — Z13220 Encounter for screening for lipoid disorders: Secondary | ICD-10-CM | POA: Diagnosis not present

## 2018-07-01 DIAGNOSIS — K5190 Ulcerative colitis, unspecified, without complications: Secondary | ICD-10-CM | POA: Diagnosis not present

## 2018-07-01 DIAGNOSIS — Z0000 Encounter for general adult medical examination without abnormal findings: Secondary | ICD-10-CM | POA: Diagnosis not present

## 2018-07-01 DIAGNOSIS — Z6821 Body mass index (BMI) 21.0-21.9, adult: Secondary | ICD-10-CM | POA: Diagnosis not present

## 2018-07-01 DIAGNOSIS — Z862 Personal history of diseases of the blood and blood-forming organs and certain disorders involving the immune mechanism: Secondary | ICD-10-CM | POA: Diagnosis not present

## 2018-11-19 DIAGNOSIS — R1033 Periumbilical pain: Secondary | ICD-10-CM | POA: Diagnosis not present

## 2018-11-20 DIAGNOSIS — N200 Calculus of kidney: Secondary | ICD-10-CM | POA: Diagnosis not present

## 2019-05-29 DIAGNOSIS — H9312 Tinnitus, left ear: Secondary | ICD-10-CM | POA: Diagnosis not present

## 2019-05-30 DIAGNOSIS — H6982 Other specified disorders of Eustachian tube, left ear: Secondary | ICD-10-CM | POA: Diagnosis not present

## 2019-06-01 DIAGNOSIS — Z20828 Contact with and (suspected) exposure to other viral communicable diseases: Secondary | ICD-10-CM | POA: Diagnosis not present

## 2019-06-04 DIAGNOSIS — H9312 Tinnitus, left ear: Secondary | ICD-10-CM | POA: Diagnosis not present

## 2019-06-04 DIAGNOSIS — H6983 Other specified disorders of Eustachian tube, bilateral: Secondary | ICD-10-CM | POA: Diagnosis not present

## 2019-06-04 DIAGNOSIS — H9041 Sensorineural hearing loss, unilateral, right ear, with unrestricted hearing on the contralateral side: Secondary | ICD-10-CM | POA: Diagnosis not present

## 2019-06-10 DIAGNOSIS — H9041 Sensorineural hearing loss, unilateral, right ear, with unrestricted hearing on the contralateral side: Secondary | ICD-10-CM | POA: Diagnosis not present

## 2019-06-10 DIAGNOSIS — H9312 Tinnitus, left ear: Secondary | ICD-10-CM | POA: Diagnosis not present

## 2019-07-19 DIAGNOSIS — Z6821 Body mass index (BMI) 21.0-21.9, adult: Secondary | ICD-10-CM | POA: Diagnosis not present

## 2019-07-19 DIAGNOSIS — Z13220 Encounter for screening for lipoid disorders: Secondary | ICD-10-CM | POA: Diagnosis not present

## 2019-07-19 DIAGNOSIS — K5190 Ulcerative colitis, unspecified, without complications: Secondary | ICD-10-CM | POA: Diagnosis not present

## 2019-07-19 DIAGNOSIS — R0789 Other chest pain: Secondary | ICD-10-CM | POA: Diagnosis not present

## 2019-07-19 DIAGNOSIS — Z0000 Encounter for general adult medical examination without abnormal findings: Secondary | ICD-10-CM | POA: Diagnosis not present

## 2021-08-08 ENCOUNTER — Other Ambulatory Visit: Payer: Self-pay | Admitting: Internal Medicine

## 2021-08-08 DIAGNOSIS — Z1231 Encounter for screening mammogram for malignant neoplasm of breast: Secondary | ICD-10-CM

## 2021-10-09 ENCOUNTER — Ambulatory Visit
Admission: RE | Admit: 2021-10-09 | Discharge: 2021-10-09 | Disposition: A | Discharge status: Home / Self Care | Payer: 59 | Source: Ambulatory Visit | Attending: Family Medicine | Admitting: Family Medicine

## 2021-10-09 ENCOUNTER — Other Ambulatory Visit: Payer: Self-pay

## 2021-10-09 ENCOUNTER — Other Ambulatory Visit: Payer: Self-pay | Admitting: Family Medicine

## 2021-10-09 DIAGNOSIS — R233 Spontaneous ecchymoses: Secondary | ICD-10-CM | POA: Diagnosis present

## 2021-10-09 DIAGNOSIS — R1902 Left upper quadrant abdominal swelling, mass and lump: Secondary | ICD-10-CM | POA: Insufficient documentation

## 2021-10-09 DIAGNOSIS — R1012 Left upper quadrant pain: Secondary | ICD-10-CM | POA: Diagnosis present

## 2022-01-04 ENCOUNTER — Other Ambulatory Visit: Payer: Self-pay | Admitting: Family Medicine

## 2022-01-04 ENCOUNTER — Ambulatory Visit
Admission: RE | Admit: 2022-01-04 | Discharge: 2022-01-04 | Disposition: A | Discharge status: Home / Self Care | Payer: 59 | Source: Ambulatory Visit | Attending: Family Medicine | Admitting: Family Medicine

## 2022-01-04 DIAGNOSIS — M7989 Other specified soft tissue disorders: Secondary | ICD-10-CM | POA: Diagnosis present

## 2022-01-04 DIAGNOSIS — M79662 Pain in left lower leg: Secondary | ICD-10-CM

## 2022-01-04 DIAGNOSIS — M79604 Pain in right leg: Secondary | ICD-10-CM

## 2022-04-24 IMAGING — US US ABDOMEN COMPLETE
1 series · 14 of 25 positions shown · non-contrast
Comparison: None.

CLINICAL DATA: Petechia, left upper quadrant pain, swelling,
confirmed concern for splenic pathology.

EXAM:
ABDOMEN ULTRASOUND COMPLETE

[Series 1: us abdomen complete · 0.15mm/px · 14 of 93 slices shown]
[im 1/93]
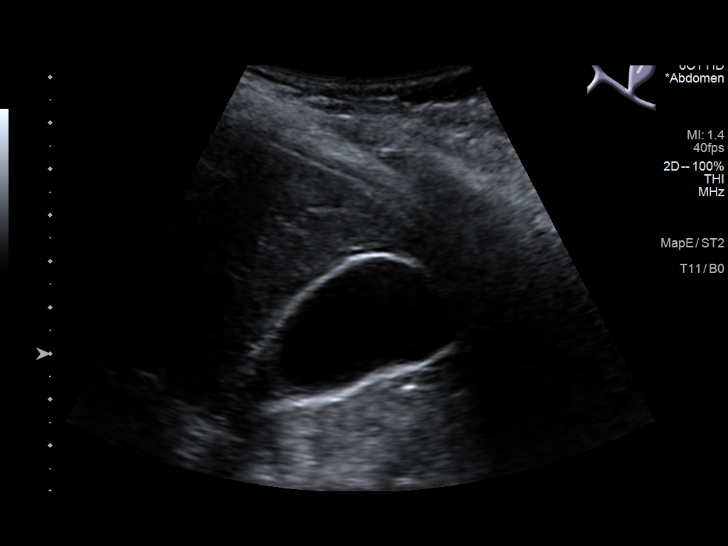
[im 8/93]
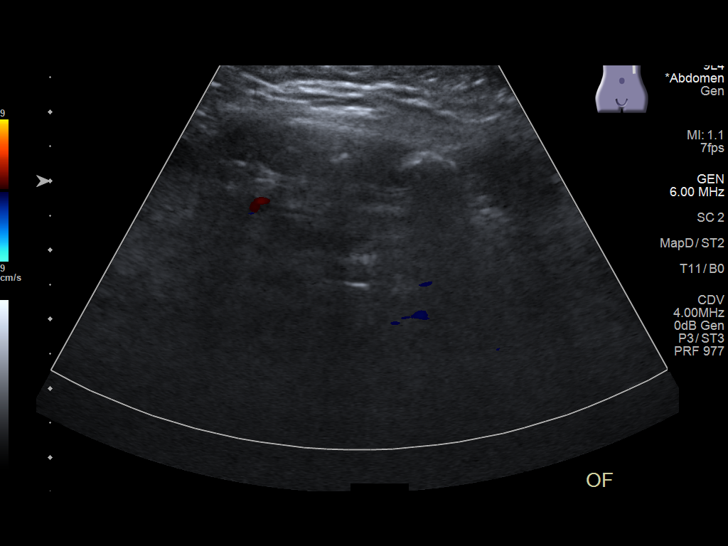
[im 16/93]
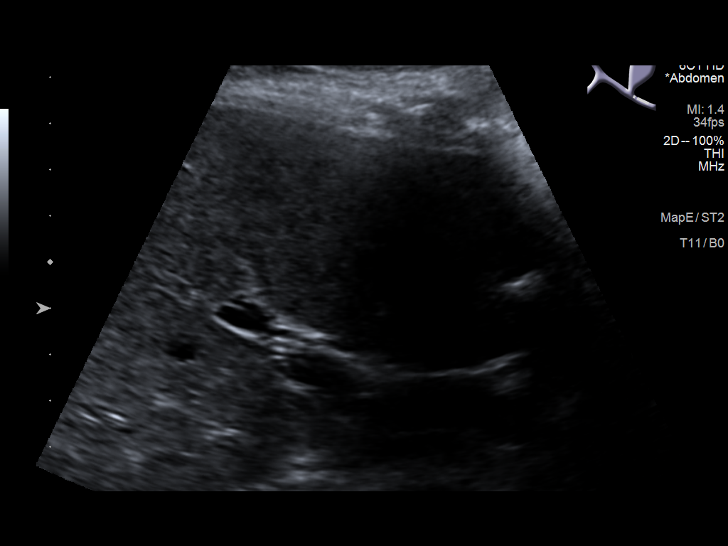
[im 24/93]
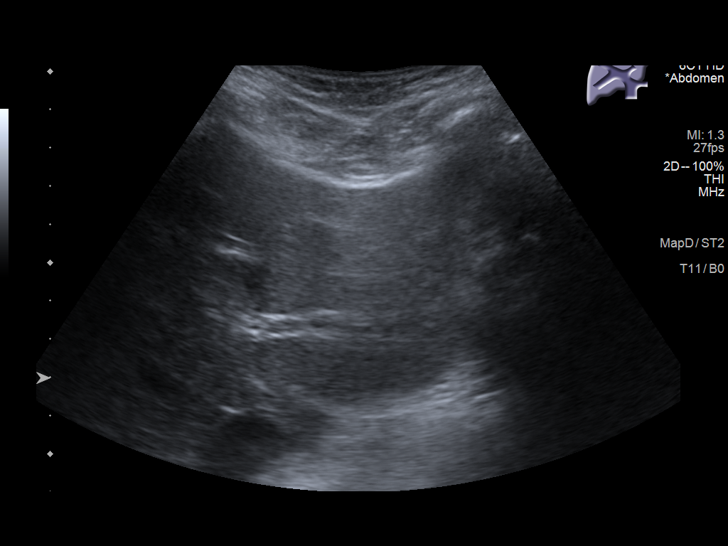
[im 31/93]
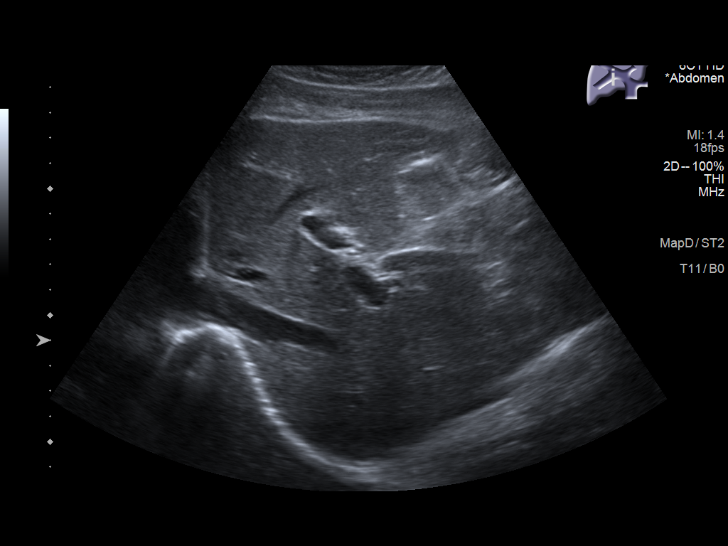
[im 35/93]
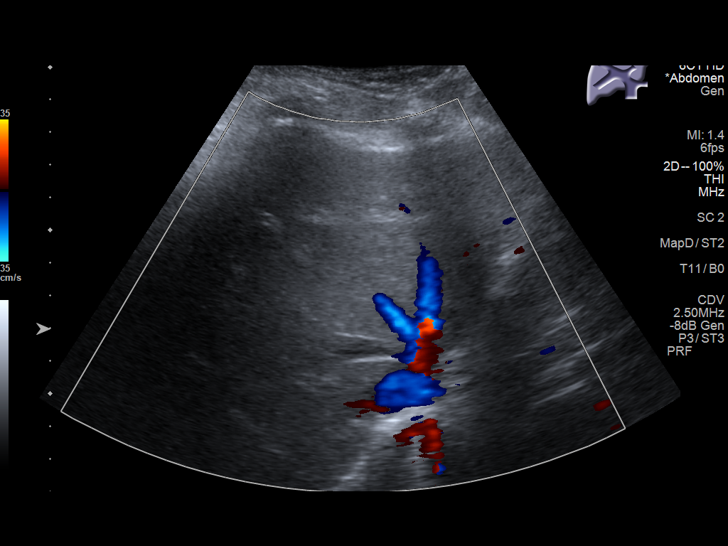
[im 43/93]
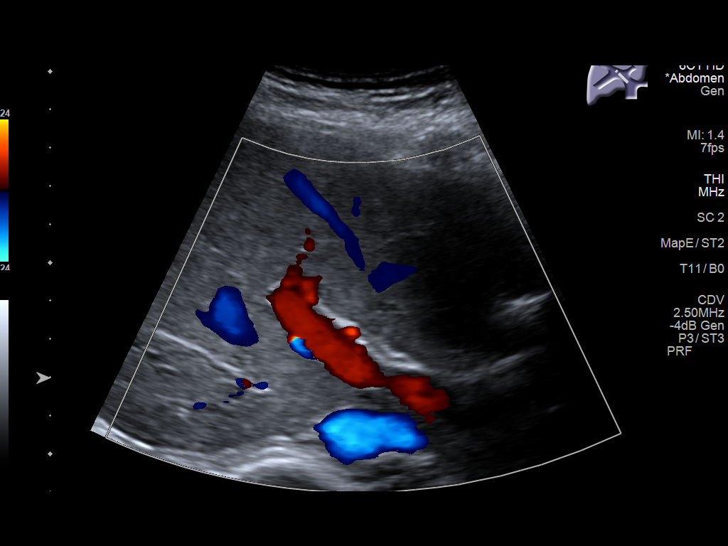
[im 50/93]
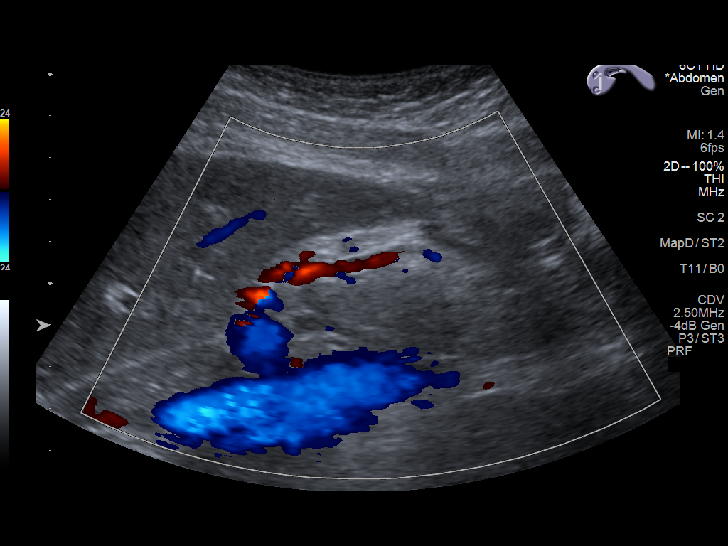
[im 58/93]
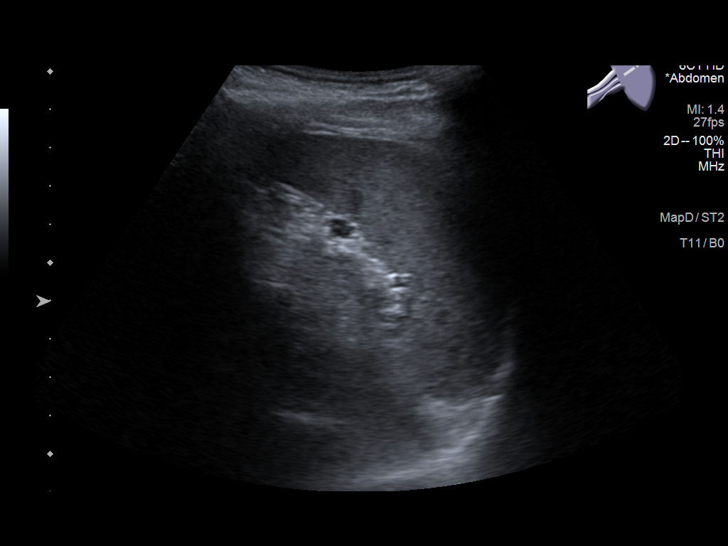
[im 62/93]
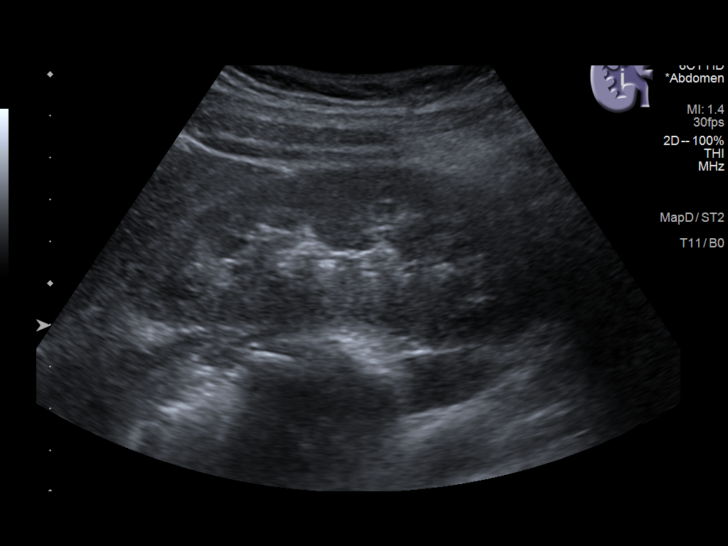
[im 70/93]
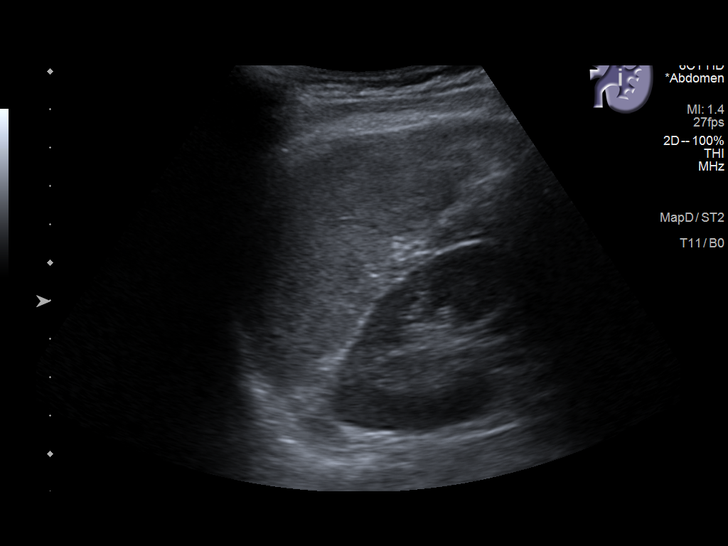
[im 77/93]
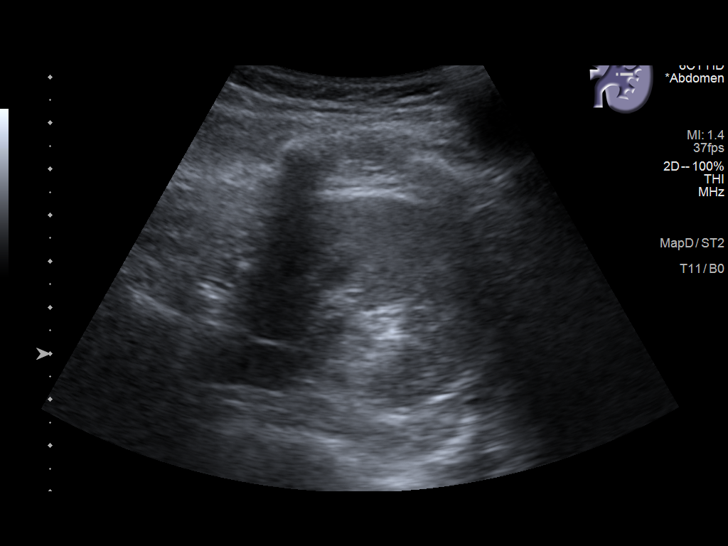
[im 85/93]
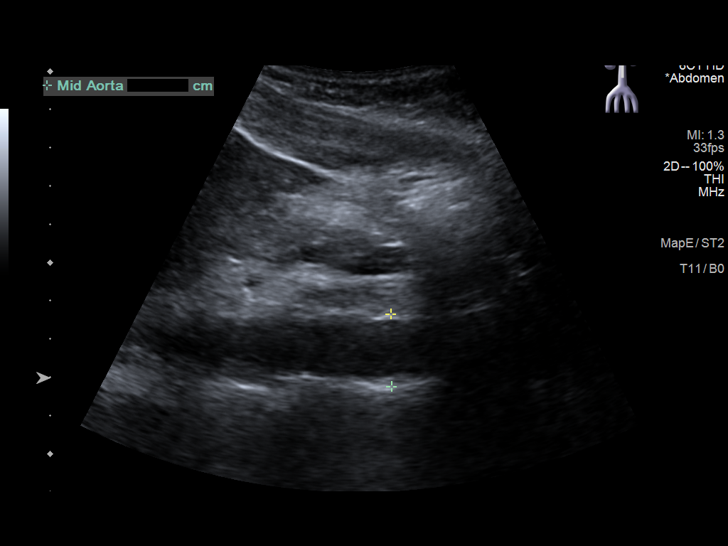
[im 93/93]
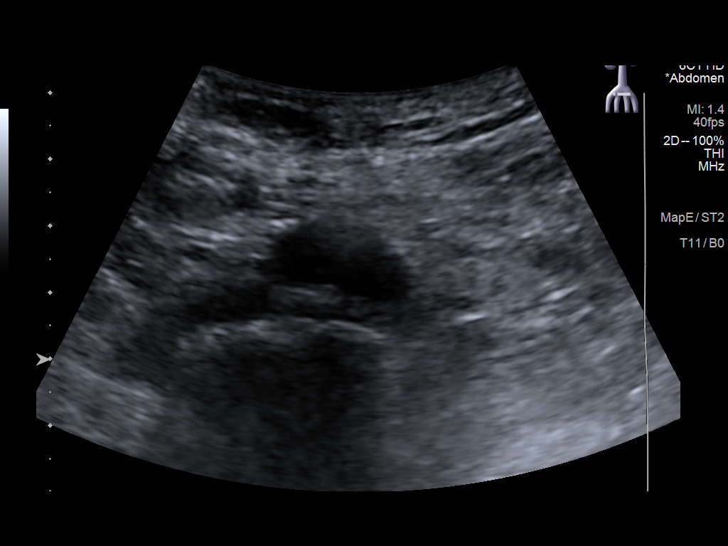

[14 of 25 positions shown; findings below may reference images not displayed]

FINDINGS: Gallbladder: No gallstones or wall thickening visualized. No
sonographic Murphy sign noted by sonographer.

Common bile duct: Diameter: 2 mm

Liver: No focal lesion identified. Within normal limits in
parenchymal echogenicity. Portal vein is patent on color Doppler
imaging with normal direction of blood flow towards the liver.

IVC: No abnormality visualized.

Pancreas: Visualized portion unremarkable.

Spleen: Size and appearance within normal limits.

Right Kidney: Length: 9.6 cm. Echogenicity within normal limits. No
mass or hydronephrosis visualized.

Left Kidney: Length: 10.4 cm. Echogenicity within normal limits. No
mass or hydronephrosis visualized.

Abdominal aorta: No aneurysm visualized.

Other findings: None.
IMPRESSION: Normal abdominal ultrasound. Specifically no splenomegaly, focal
splenic lesion or left upper quadrant mass visualized in the
patient's area of concern.

## 2022-07-03 ENCOUNTER — Other Ambulatory Visit: Payer: Self-pay | Admitting: Obstetrics and Gynecology

## 2022-07-10 NOTE — H&P (Signed)
Rhonda Sutton is a 41 y.o. female here for fx D+C , h/s , myosure and Novasure ablation  . Pt with severe menorrhagia and what appears to be a submucosal fibroid was placed on OCPs 7/23 . Shortly after she started with bilateral knee pain . Work up for knee neg by Rheumatology. She doesn't want to stop the OCP to see if this is the cause .  She did have an odd rash on leg in summer before the knee pain    Known submucosal fibroid 10/2021: Saline infusion sonohysterography: betadine prep to the cervix followed by placement of the HSG catheter into the endometrial canal . Sterile H2O is injected while performing a transvaginal u/s . Findings: Saline  [place]   Fibroid  Encroachment into endometrium= 0.886 x 1.43 x 2.26 cm Fibroid ut   1  Lt  fundal at endometrium= 3.55 cm   Endometrium=10.57 mm   Lt ov wnl   Rt ov septated  Cyst=  3.02 cm   Septation=0.230 cm   EMBX 3/23 - neg    Hair loss has stabilized    Past Medical History:  has a past medical history of GERD (gastroesophageal reflux disease) and HELLP syndrome.  Past Surgical History:  has a past surgical history that includes Tonsillectomy and Breast surgery. Family History: family history includes Allergic rhinitis in her daughter; Clotting disorder in her father; Diabetes type II in her mother; Heart disease in her mother; High blood pressure (Hypertension) in her brother, brother, and mother. Social History:  reports that she has quit smoking. Her smoking use included cigarettes. She smoked an average of .25 packs per day. She has never used smokeless tobacco. She reports current alcohol use. She reports that she does not use drugs. OB/GYN History:  OB History       Gravida  2   Para  2   Term      Preterm      AB      Living  2        SAB      IAB      Ectopic      Molar      Multiple      Live Births  2             Allergies: is allergic to codeine. Medications:   Current Outpatient Medications:     cholecalciferol (VITAMIN D3) 1000 unit tablet, Take by mouth, Disp: , Rfl:    fexofenadine (ALLEGRA) 180 MG tablet, Take 180 mg by mouth once daily, Disp: , Rfl:    levonorgestrel-ethinyl estradiol (LOSEASONIQUE) tablet, Take 1 tablet by mouth once daily, Disp: 84 tablet, Rfl: 0   mesalamine (CANASA) 1000 MG suppository, PLACE 1 SUPPOSITORY (1,000 MG TOTAL) RECTALLY AT BEDTIME, Disp: 30 suppository, Rfl: 0   multivitamin tablet, Take 1 tablet by mouth once daily, Disp: , Rfl:    levonorgestrel-ethinyl estradiol (LOSEASONIQUE) tablet, Take 1 tablet by mouth once daily (Patient not taking: Reported on 06/18/2022), Disp: 84 tablet, Rfl: 0   Review of Systems: General:                      No fatigue or weight loss Eyes:                           No vision changes Ears:  No hearing difficulty Respiratory:                No cough or shortness of breath Pulmonary:                  No asthma or shortness of breath Cardiovascular:           No chest pain, palpitations, dyspnea on exertion Gastrointestinal:          No abdominal bloating, chronic diarrhea, constipations, masses, pain or hematochezia Genitourinary:             No hematuria, dysuria, abnormal vaginal discharge, pelvic pain, Menometrorrhagia Lymphatic:                   No swollen lymph nodes Musculoskeletal:         No muscle weakness Neurologic:                  No extremity weakness, syncope, seizure disorder Psychiatric:                  No history of depression, delusions or suicidal/homicidal ideation      Exam:       Vitals:  07/11/22   BP: 126/85  Pulse: 81      Body mass index is 21.93 kg/m.   WDWN white female in NAD    Exam from 11/13/21 Pelvic: tanner stage 5 ,  External genitalia: vulva /labia no lesions Urethra: no prolapse Vagina: normal physiologic d/c, adequate room for TVH  Cervix:  Anterior  no lesions, no cervical motion tenderness   Uterus: normal size shape and contour,  non-tender Adnexa: no mass,  non-tender     Impression:    The primary encounter diagnosis was Menometrorrhagia. Diagnoses of Fibroids, submucosal and Arthritis of both knees were also pertinent to this visit.       Plan:  After  a thorough discussion of options she wishes to undergo a D+C, myosure resection of submucosal fibroid and Novasure ablation .    She may elect afterwards to stop OCPs to see if this is a cause of her arthritis .    Pt has been counseled for risks , see [facility] notes        .   Vilma Prader, MD

## 2022-07-20 IMAGING — US US EXTREM LOW VENOUS*L*
1 series · 13 of 24 positions shown · non-contrast
Comparison: None Available.

CLINICAL DATA: Left leg pain and swelling



[Series 1: us venous img lower uni left (dvt) · portal-venous · 13 of 39 slices shown]
[im 1/39]
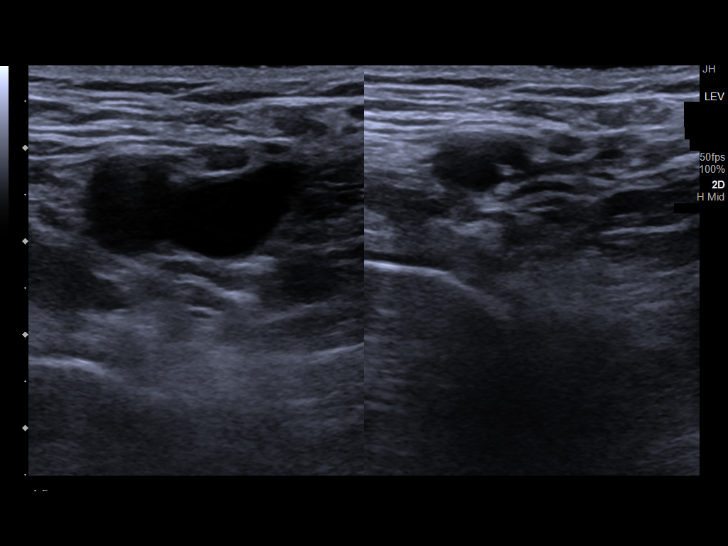
[im 4/39]
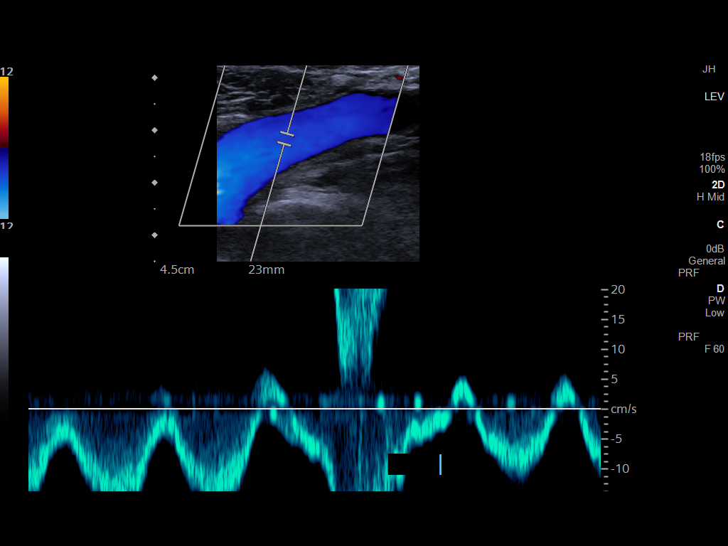
[im 7/39]
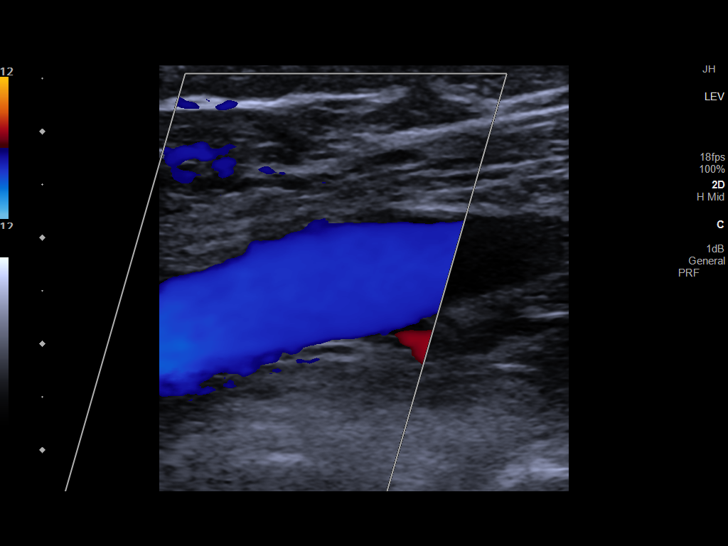
[im 10/39]
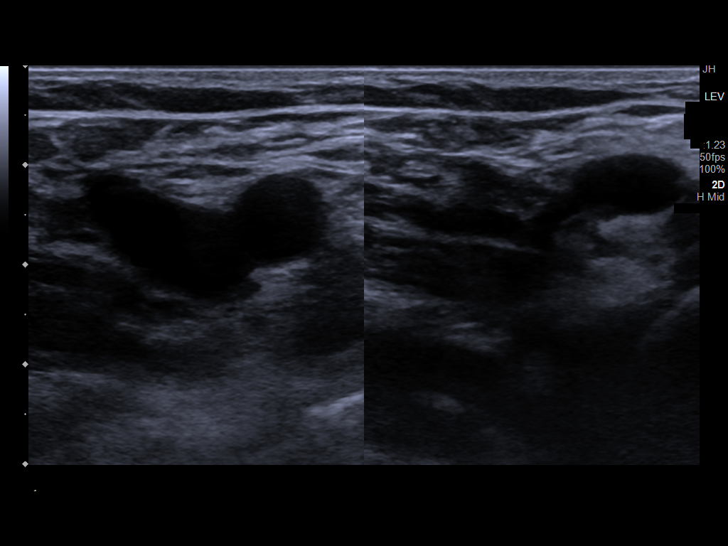
[im 14/39]
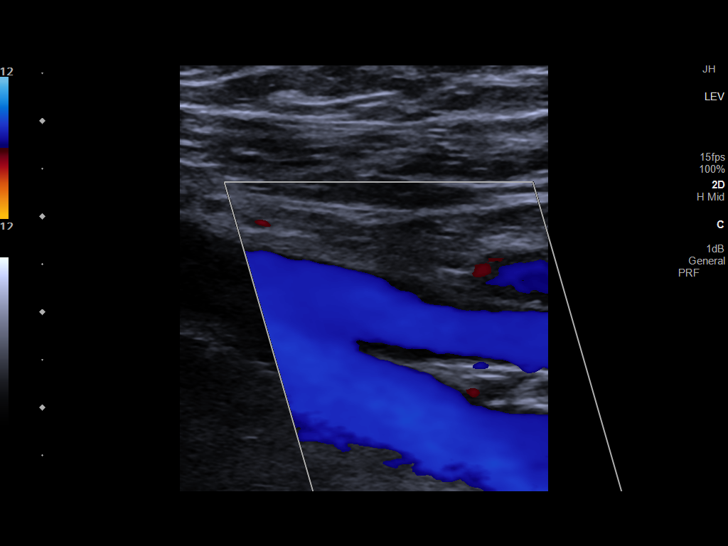
[im 17/39]
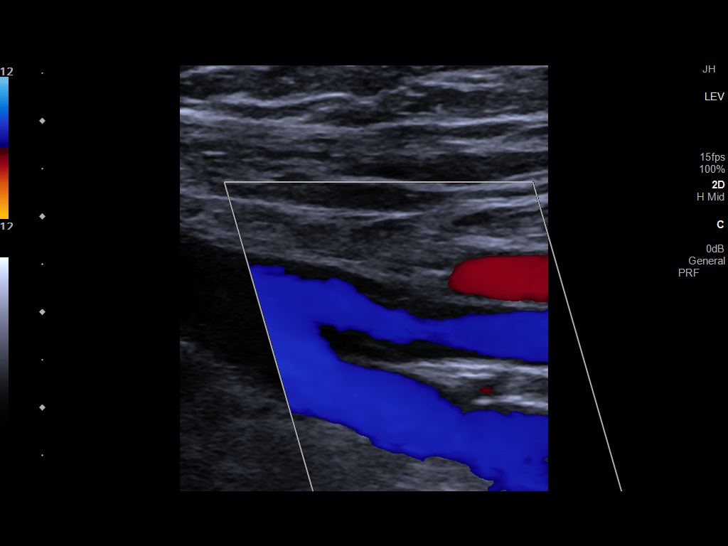
[im 20/39]
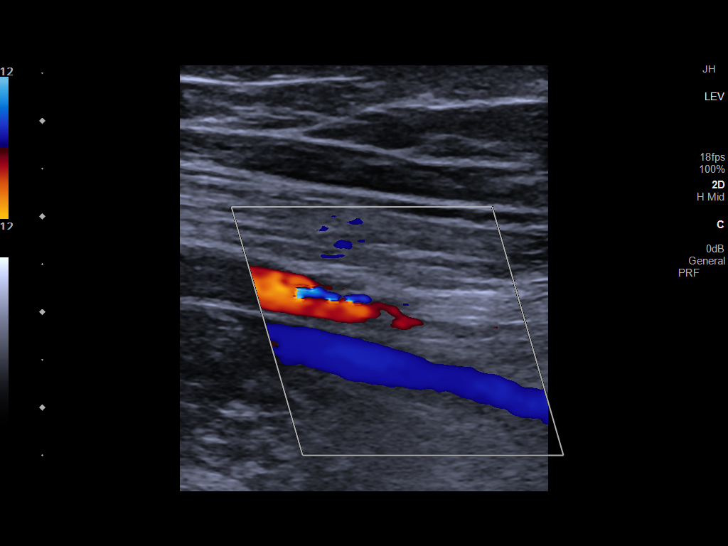
[im 22/39]
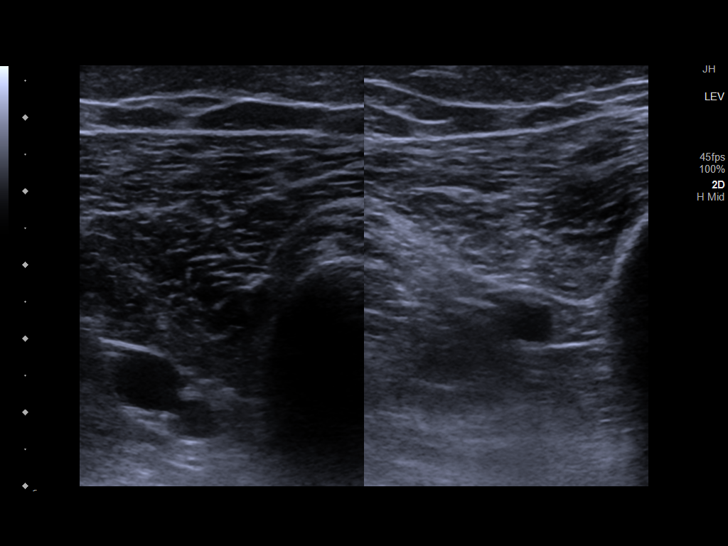
[im 25/39]
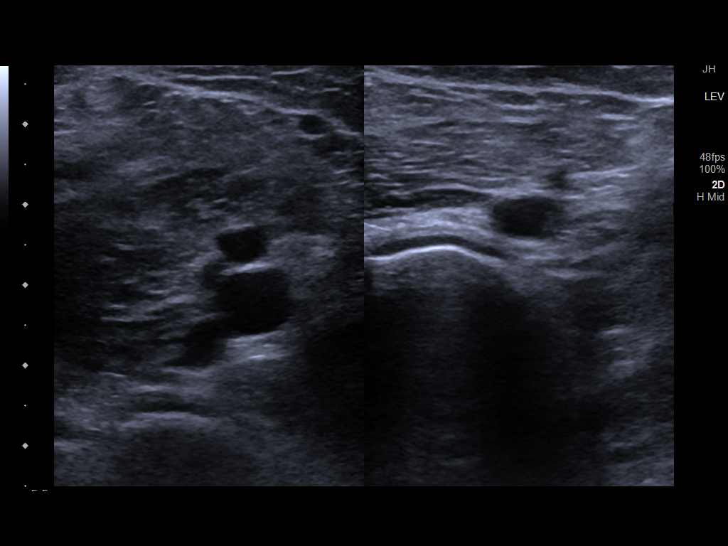
[im 29/39]
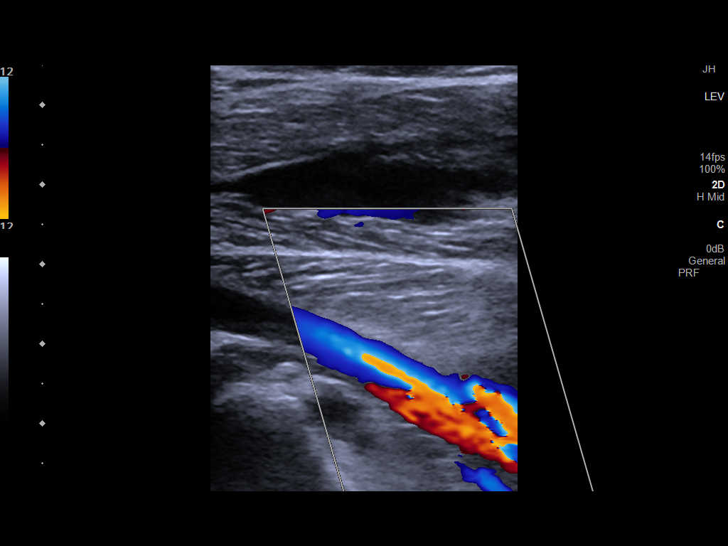
[im 32/39]
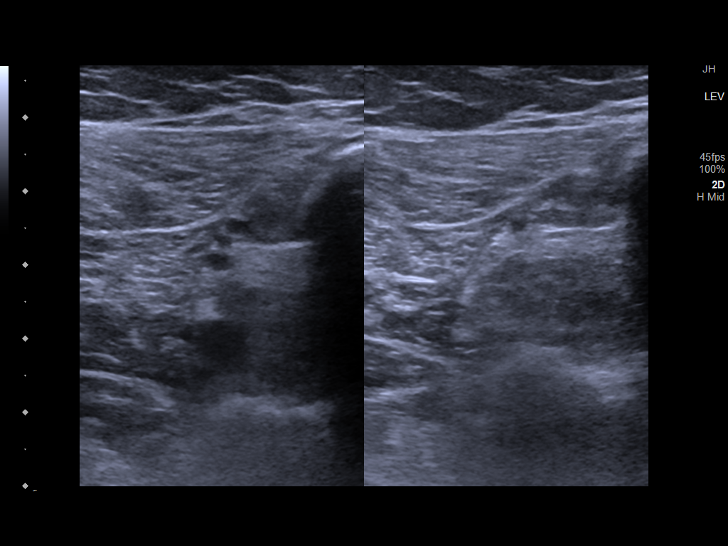
[im 35/39]
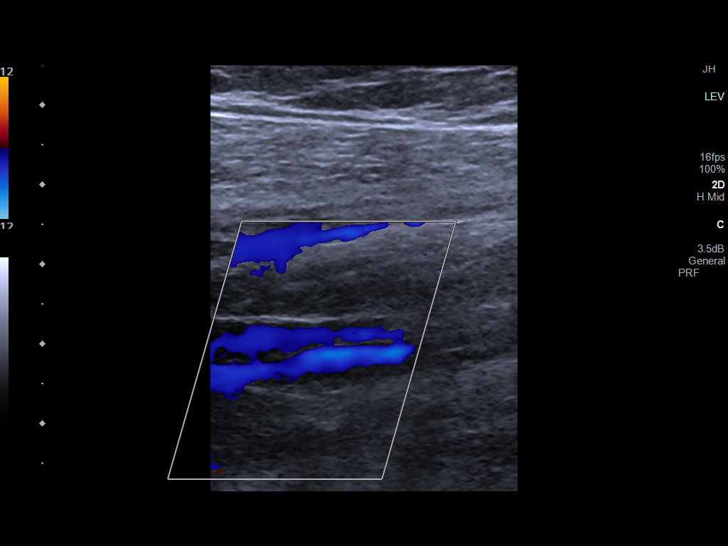
[im 39/39]
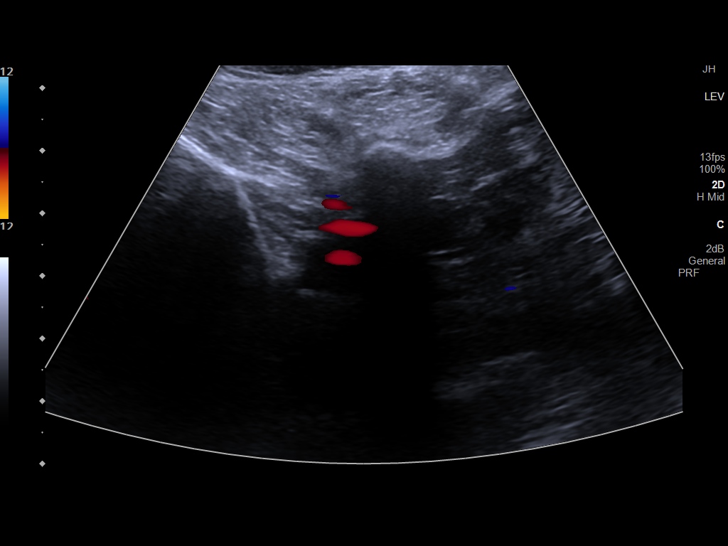

[13 of 24 positions shown; findings below may reference images not displayed]

FINDINGS: Contralateral Common Femoral Vein: Respiratory phasicity is normal
and symmetric with the symptomatic side. No evidence of thrombus.
Normal compressibility.

Common Femoral Vein: No evidence of thrombus. Normal
compressibility, respiratory phasicity and response to augmentation.

Saphenofemoral Junction: No evidence of thrombus. Normal
compressibility and flow on color Doppler imaging.

Profunda Femoral Vein: No evidence of thrombus. Normal
compressibility and flow on color Doppler imaging.

Femoral Vein: No evidence of thrombus. Normal compressibility,
respiratory phasicity and response to augmentation.

Popliteal Vein: No evidence of thrombus. Normal compressibility,
respiratory phasicity and response to augmentation.

Calf Veins: No evidence of thrombus. Normal compressibility and flow
on color Doppler imaging.
IMPRESSION: No evidence of deep venous thrombosis.

## 2022-07-24 ENCOUNTER — Inpatient Hospital Stay: Admission: RE | Admit: 2022-07-24 | Payer: 59 | Source: Ambulatory Visit

## 2022-08-02 ENCOUNTER — Ambulatory Visit: Admit: 2022-08-02 | Payer: 59 | Admitting: Obstetrics and Gynecology

## 2022-08-02 DIAGNOSIS — Z01818 Encounter for other preprocedural examination: Secondary | ICD-10-CM

## 2022-08-02 SURGERY — DILATATION & CURETTAGE/HYSTEROSCOPY WITH NOVASURE ABLATION
Anesthesia: Choice

## 2023-08-30 ENCOUNTER — Emergency Department (HOSPITAL_BASED_OUTPATIENT_CLINIC_OR_DEPARTMENT_OTHER): Payer: 59

## 2023-08-30 ENCOUNTER — Emergency Department (HOSPITAL_BASED_OUTPATIENT_CLINIC_OR_DEPARTMENT_OTHER)
Admission: EM | Admit: 2023-08-30 | Discharge: 2023-08-30 | Disposition: A | Discharge status: Home / Self Care | Payer: 59 | Attending: Emergency Medicine | Admitting: Emergency Medicine

## 2023-08-30 ENCOUNTER — Other Ambulatory Visit: Payer: Self-pay

## 2023-08-30 DIAGNOSIS — R110 Nausea: Secondary | ICD-10-CM

## 2023-08-30 DIAGNOSIS — Z20822 Contact with and (suspected) exposure to covid-19: Secondary | ICD-10-CM | POA: Insufficient documentation

## 2023-08-30 DIAGNOSIS — K529 Noninfective gastroenteritis and colitis, unspecified: Secondary | ICD-10-CM | POA: Diagnosis not present

## 2023-08-30 DIAGNOSIS — R197 Diarrhea, unspecified: Secondary | ICD-10-CM | POA: Diagnosis present

## 2023-08-30 LAB — URINALYSIS, ROUTINE W REFLEX MICROSCOPIC
Bilirubin Urine: NEGATIVE
Glucose, UA: NEGATIVE mg/dL
Ketones, ur: 40 mg/dL — AB
Nitrite: NEGATIVE
Protein, ur: 30 mg/dL — AB
Specific Gravity, Urine: 1.026 (ref 1.005–1.030)
pH: 6 (ref 5.0–8.0)

## 2023-08-30 LAB — RESP PANEL BY RT-PCR (RSV, FLU A&B, COVID)  RVPGX2
Influenza A by PCR: NEGATIVE
Influenza B by PCR: NEGATIVE
Resp Syncytial Virus by PCR: NEGATIVE
SARS Coronavirus 2 by RT PCR: NEGATIVE

## 2023-08-30 LAB — CBC
HCT: 45.6 % (ref 36.0–46.0)
Hemoglobin: 15.2 g/dL — ABNORMAL HIGH (ref 12.0–15.0)
MCH: 31.2 pg (ref 26.0–34.0)
MCHC: 33.3 g/dL (ref 30.0–36.0)
MCV: 93.6 fL (ref 80.0–100.0)
Platelets: 258 10*3/uL (ref 150–400)
RBC: 4.87 MIL/uL (ref 3.87–5.11)
RDW: 13.1 % (ref 11.5–15.5)
WBC: 9.3 10*3/uL (ref 4.0–10.5)
nRBC: 0 % (ref 0.0–0.2)

## 2023-08-30 LAB — PREGNANCY, URINE: Preg Test, Ur: NEGATIVE

## 2023-08-30 LAB — COMPREHENSIVE METABOLIC PANEL
ALT: 9 U/L (ref 0–44)
AST: 11 U/L — ABNORMAL LOW (ref 15–41)
Albumin: 4.2 g/dL (ref 3.5–5.0)
Alkaline Phosphatase: 54 U/L (ref 38–126)
Anion gap: 10 (ref 5–15)
BUN: 11 mg/dL (ref 6–20)
CO2: 24 mmol/L (ref 22–32)
Calcium: 8.8 mg/dL — ABNORMAL LOW (ref 8.9–10.3)
Chloride: 104 mmol/L (ref 98–111)
Creatinine, Ser: 0.81 mg/dL (ref 0.44–1.00)
GFR, Estimated: >60 mL/min (ref >60)
Glucose, Bld: 98 mg/dL (ref 70–99)
Potassium: 3.4 mmol/L — ABNORMAL LOW (ref 3.5–5.1)
Sodium: 138 mmol/L (ref 135–145)
Total Bilirubin: 0.6 mg/dL (ref 0.0–1.2)
Total Protein: 7.3 g/dL (ref 6.5–8.1)

## 2023-08-30 LAB — LIPASE, BLOOD: Lipase: 29 U/L (ref 11–51)

## 2023-08-30 MED ORDER — SODIUM CHLORIDE 0.9 % IV BOLUS
1000.0000 mL | Freq: Once | INTRAVENOUS | Status: AC
  Administered 2023-08-30: 1000 mL via INTRAVENOUS

## 2023-08-30 MED ORDER — ONDANSETRON HCL 4 MG/2ML IJ SOLN
4.0000 mg | Freq: Once | INTRAMUSCULAR | Status: AC
  Administered 2023-08-30: 4 mg via INTRAVENOUS
  Filled 2023-08-30: qty 2

## 2023-08-30 MED ORDER — METRONIDAZOLE 500 MG PO TABS: 500.0000 mg | ORAL_TABLET | Freq: Two times a day (BID) | ORAL | 0 refills | Status: AC

## 2023-08-30 MED ORDER — CEPHALEXIN 500 MG PO CAPS: 500.0000 mg | ORAL_CAPSULE | Freq: Four times a day (QID) | ORAL | 0 refills | Status: AC

## 2023-08-30 MED ORDER — PREDNISONE 20 MG PO TABS: 60.0000 mg | ORAL_TABLET | Freq: Every day | ORAL | 0 refills | Status: AC

## 2023-08-30 MED ORDER — ONDANSETRON 4 MG PO TBDP: 4.0000 mg | ORAL_TABLET | Freq: Three times a day (TID) | ORAL | 0 refills | Status: AC | PRN

## 2023-08-30 MED ORDER — IOHEXOL 350 MG/ML SOLN
80.0000 mL | Freq: Once | INTRAVENOUS | Status: AC | PRN
  Administered 2023-08-30: 80 mL via INTRAVENOUS

## 2023-08-30 NOTE — ED Triage Notes (Signed)
 Pt POV from home reporting abd cramping and diarrhea since Thursday. Went to UC and sent to ED for labs and imaging.

## 2023-08-30 NOTE — ED Provider Notes (Signed)
 [place] EMERGENCY DEPARTMENT AT [facility] Provider Note   CSN: [id number] Arrival date & time: 08/30/23  1511     History  Chief Complaint  Patient presents with   Abdominal Pain   Diarrhea    Rhonda Sutton is a 43 y.o. female.  With a history of GERD, ulcerative colitis, status post appendectomy presenting to the ED for evaluation of diarrhea and abdominal pain.  Symptoms began approximately 3 days ago and have worsened.  She reports more episodes of diarrhea than she can count.  States that recently the liquid stool has been tented red.  She reports generalized abdominal cramping when she feels like she has to have a bowel movement.  She denies any pain at rest.  No fevers.  Some nausea but no vomiting.  No urinary complaints.  No recent travel, antibiotics, hospitalizations.  She denies melena.  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  States this does not feel like proctitis.   Abdominal Pain Associated symptoms: diarrhea and nausea   Diarrhea Associated symptoms: abdominal pain        Home Medications Prior to Admission medications   Medication Sig Start Date End Date Taking? Authorizing Provider  cephALEXin  (KEFLEX ) 500 MG capsule Take 1 capsule (500 mg total) by mouth 4 (four) times daily. 08/30/23  Yes Wylie Russon, Marsa HERO, PA-C  metroNIDAZOLE  (FLAGYL ) 500 MG tablet Take 1 tablet (500 mg total) by mouth 2 (two) times daily. 08/30/23  Yes Abram Sax, Marsa HERO, PA-C  ondansetron  (ZOFRAN -ODT) 4 MG disintegrating tablet Take 1 tablet (4 mg total) by mouth every 8 (eight) hours as needed for nausea or vomiting. 08/30/23  Yes Charle Clear, Marsa HERO, PA-C  predniSONE  (DELTASONE ) 20 MG tablet Take 3 tablets (60 mg total) by mouth daily for 7 days. 08/30/23 09/06/23 Yes Harm Jou, Marsa HERO, PA-C  Calcium Carbonate Antacid (TUMS PO) Take 2 tablets by mouth daily as needed. For heartburn     [provider]  pantoprazole  (PROTONIX ) 40 MG tablet Take 40 mg by mouth at bedtime.      [provider]  predniSONE  (DELTASONE ) 20 MG tablet Take 60 mg by mouth daily.      [provider]  Prenat w/o A-[id number]-DHA (CITRANATAL HARMONY PO) Take 1 tablet by mouth daily.      [provider]      Allergies    Codeine    Review of Systems   Review of Systems  Gastrointestinal:  Positive for abdominal pain, diarrhea and nausea.  All other systems reviewed and are negative.   Physical Exam Updated Vital Signs BP 119/85   Pulse 92   Resp 16   Ht 5' 7 (1.702 m)   Wt 63.5 kg   LMP 08/16/2023 (Approximate)   SpO2 100%   BMI 21.93 kg/m  Physical Exam Vitals and nursing note reviewed.  Constitutional:      General: She is not in acute distress.    Appearance: Normal appearance. She is normal weight. She is not ill-appearing.  HENT:     Head: Normocephalic and atraumatic.  Cardiovascular:     Rate and Rhythm: Normal rate and regular rhythm.  Pulmonary:     Effort: Pulmonary effort is normal. No respiratory distress.  Abdominal:     General: Abdomen is flat.     Tenderness: There is generalized abdominal tenderness. There is no guarding.  Musculoskeletal:  General: Normal range of motion.     Cervical back: Neck supple.  Skin:    General: Skin is warm and dry.  Neurological:     Mental Status: She is alert and oriented to person, place, and time.  Psychiatric:        Mood and Affect: Mood normal.        Behavior: Behavior normal.     ED Results / Procedures / Treatments   Labs (all labs ordered are listed, but only abnormal results are displayed) Labs Reviewed  COMPREHENSIVE METABOLIC PANEL - Abnormal; Notable for the following components:      Result Value   Potassium 3.4 (*)    Calcium 8.8 (*)    AST 11 (*)    All other components within normal limits  CBC - Abnormal; Notable for the following components:   Hemoglobin 15.2  (*)    All other components within normal limits  URINALYSIS, ROUTINE W REFLEX MICROSCOPIC - Abnormal; Notable for the following components:   APPearance CLOUDY (*)    Hgb urine dipstick MODERATE (*)    Ketones, ur 40 (*)    Protein, ur 30 (*)    Leukocytes,Ua TRACE (*)    Bacteria, UA MANY (*)    All other components within normal limits  RESP PANEL BY RT-PCR (RSV, FLU A&B, COVID)  RVPGX2  LIPASE, BLOOD  PREGNANCY, URINE    EKG None  Radiology CT ABDOMEN PELVIS W CONTRAST Result Date: 08/30/2023 CLINICAL DATA:  Left lower quadrant abdominal pain for 3 days, stomach flu not improving EXAM: CT ABDOMEN AND PELVIS WITH CONTRAST TECHNIQUE: Multidetector CT imaging of the abdomen and pelvis was performed using the standard protocol following bolus administration of intravenous contrast. RADIATION DOSE REDUCTION: This exam was performed according to the departmental dose-optimization program which includes automated exposure control, adjustment of the mA and/or kV according to patient size and/or use of iterative reconstruction technique. CONTRAST:  80mL OMNIPAQUE  IOHEXOL  350 MG/ML SOLN COMPARISON:  None Available. FINDINGS: Lower chest: No acute abnormality.  Bilateral breast implants. Hepatobiliary: No solid liver abnormality is seen. No gallstones, gallbladder wall thickening, or biliary dilatation. Pancreas: Unremarkable. No pancreatic ductal dilatation or surrounding inflammatory changes. Spleen: Normal in size without significant abnormality. Adrenals/Urinary Tract: Adrenal glands are unremarkable. Small nonobstructive calculus of the inferior pole of the right kidney. Bladder is unremarkable. Stomach/Bowel: Stomach is within normal limits. Long segment wall thickening and mucosal hyperenhancement involving the transverse colon through the rectum (series 2, image 22, 75). Status post appendectomy. Vascular/Lymphatic: No significant vascular findings are present. No enlarged abdominal or pelvic  lymph nodes. Reproductive: No mass or other significant abnormality. Other: No abdominal wall hernia or abnormality. No ascites. Musculoskeletal: No acute or significant osseous findings. IMPRESSION: 1. Long segment wall thickening and mucosal hyperenhancement involving the transverse colon through the rectum, consistent with nonspecific infectious or inflammatory colitis. 2. Nonobstructive right nephrolithiasis . Electronically Signed   By: Marolyn JONETTA Jaksch M.D.   On: 08/30/2023 16:52    Procedures Procedures    Medications Ordered in ED Medications  sodium chloride  0.9 % bolus 1,000 mL (0 mLs Intravenous Stopped 08/30/23 1705)  iohexol  (OMNIPAQUE ) 350 MG/ML injection 80 mL (80 mLs Intravenous Contrast Given 08/30/23 1631)  ondansetron  (ZOFRAN ) injection 4 mg (4 mg Intravenous Given 08/30/23 1729)    ED Course/ Medical Decision Making/ A&P  Medical Decision Making Amount and/or Complexity of Data Reviewed Labs: ordered. Radiology: ordered.  Risk Prescription drug management.  This patient presents to the ED for concern of abdominal pain, diarrhea, this involves an extensive number of treatment options, and is a complaint that carries with it a high risk of complications and morbidity.  The differential diagnosis for generalized abdominal pain includes, but is not limited to AAA, gastroenteritis, appendicitis, Bowel obstruction, Bowel perforation. Gastroparesis, DKA, Hernia, Inflammatory bowel disease, mesenteric ischemia, pancreatitis, peritonitis SBP, volvulus.   My initial workup includes labs, imaging, fluids  Additional history obtained from: Nursing notes from this visit.  I ordered, reviewed and interpreted labs which include: CBC, CMP, lipase, urinalysis, urine pregnancy, respiratory panel  I ordered imaging studies including CT abdomen pelvis I independently visualized and interpreted imaging which showed nonspecific infectious versus inflammatory  colitis I agree with the radiologist interpretation  43 year old female presenting to the ED for evaluation of diarrhea.  Symptoms began approximately 3 days ago and have worsened.  States he noticed some blood-tinged diarrhea today.  She has cramping abdominal pain associated with episodes of diarrhea.  No pain at rest.  She reports some nausea but no vomiting.  Lab workup reassuring.  No white count.  Urine concerning for UTI.  Does not have symptoms of cystitis.  CT abdomen pelvis concerning for infectious versus inflammatory colitis.  Patient does have a history of ulcerative colitis proctitis.  Had shared decision-making conversation with the patient regarding management.  Overall, will initiate steroid burst and antibiotics.  She was encouraged to follow-up with her GI provider at [facility].  She was given return precautions.  Stable at discharge.  At this time there does not appear to be any evidence of an acute emergency medical condition and the patient appears stable for discharge with appropriate outpatient follow up. Diagnosis was discussed with patient who verbalizes understanding of care plan and is agreeable to discharge. I have discussed return precautions with patient and husband who verbalizes understanding. Patient encouraged to follow-up with their primary GI provider. All questions answered.  Patient's case discussed with Dr. Dreama who agrees with plan to discharge with follow-up.   Note: Portions of this report may have been transcribed using voice recognition software. Every effort was made to ensure accuracy; however, inadvertent computerized transcription errors may still be present.         Final Clinical Impression(s) / ED Diagnoses Final diagnoses:  Colitis  Nausea    Rx / DC Orders ED Discharge Orders          Ordered    predniSONE  (DELTASONE ) 20 MG tablet  Daily        08/30/23 1815    cephALEXin  (KEFLEX ) 500 MG capsule  4 times daily         08/30/23 1815    metroNIDAZOLE  (FLAGYL ) 500 MG tablet  2 times daily        08/30/23 1815    ondansetron  (ZOFRAN -ODT) 4 MG disintegrating tablet  Every 8 hours PRN        08/30/23 1815              Edwardo Marsa HERO, DEVONNA 08/30/23 1816    Dreama Longs, MD 09/02/23 1042

## 2023-08-30 NOTE — Discharge Instructions (Signed)
 You have been seen today for your complaint of diarrhea. Your lab work was overall reassuring. Your imaging showed nonspecific inflammation of the colon. Your discharge medications include Zofran .  This is a nausea medicine.  Take it as needed. Keflex  and metronidazole . This is an antibiotic. You should take it as prescribed. You should take it for the entire duration of the prescription. This may cause an upset stomach. This is normal. You may take this with food. You may also eat yogurt to prevent diarrhea. Follow up with: Your primary GI provider Please seek immediate medical care if you develop any of the following symptoms: You have chest pain or your heart is beating very quickly. You have trouble breathing or you are breathing very quickly. You feel extremely weak or you faint. At this time there does not appear to be the presence of an emergent medical condition, however there is always the potential for conditions to change. Please read and follow the below instructions.  Do not take your medicine if  develop an itchy rash, swelling in your mouth or lips, or difficulty breathing; call 911 and seek immediate emergency medical attention if this occurs.  You may review your lab tests and imaging results in their entirety on your MyChart account.  Please discuss all results of fully with your primary care provider and other specialist at your follow-up visit.  Note: Portions of this text may have been transcribed using voice recognition software. Every effort was made to ensure accuracy; however, inadvertent computerized transcription errors may still be present.

## 2023-09-19 ENCOUNTER — Ambulatory Visit: Payer: 59 | Admitting: Certified Registered"

## 2023-09-19 ENCOUNTER — Other Ambulatory Visit: Payer: Self-pay

## 2023-09-19 ENCOUNTER — Ambulatory Visit
Admission: RE | Admit: 2023-09-19 | Discharge: 2023-09-19 | Disposition: A | Discharge status: Home / Self Care | Payer: 59 | Attending: Gastroenterology | Admitting: Gastroenterology

## 2023-09-19 ENCOUNTER — Encounter
Admission: RE | Disposition: A | Discharge status: Home / Self Care | Payer: Self-pay | Source: Home / Self Care | Attending: Gastroenterology

## 2023-09-19 ENCOUNTER — Encounter: Payer: Self-pay | Admitting: *Deleted

## 2023-09-19 DIAGNOSIS — R933 Abnormal findings on diagnostic imaging of other parts of digestive tract: Secondary | ICD-10-CM | POA: Insufficient documentation

## 2023-09-19 DIAGNOSIS — Z7952 Long term (current) use of systemic steroids: Secondary | ICD-10-CM | POA: Insufficient documentation

## 2023-09-19 DIAGNOSIS — K219 Gastro-esophageal reflux disease without esophagitis: Secondary | ICD-10-CM | POA: Insufficient documentation

## 2023-09-19 DIAGNOSIS — K640 First degree hemorrhoids: Secondary | ICD-10-CM | POA: Insufficient documentation

## 2023-09-19 DIAGNOSIS — Z79899 Other long term (current) drug therapy: Secondary | ICD-10-CM | POA: Diagnosis not present

## 2023-09-19 DIAGNOSIS — Z8719 Personal history of other diseases of the digestive system: Secondary | ICD-10-CM | POA: Insufficient documentation

## 2023-09-19 HISTORY — PX: FLEXIBLE SIGMOIDOSCOPY: SHX5431

## 2023-09-19 HISTORY — PX: BIOPSY: SHX5522

## 2023-09-19 LAB — POCT PREGNANCY, URINE: Preg Test, Ur: NEGATIVE

## 2023-09-19 SURGERY — SIGMOIDOSCOPY, FLEXIBLE
Anesthesia: General

## 2023-09-19 MED ORDER — PROPOFOL 10 MG/ML IV BOLUS
INTRAVENOUS | Status: DC | PRN
  Administered 2023-09-19: 180 ug/kg/min via INTRAVENOUS
  Administered 2023-09-19: 150 mg via INTRAVENOUS

## 2023-09-19 MED ORDER — LIDOCAINE HCL (PF) 2 % IJ SOLN
INTRAMUSCULAR | Status: DC | PRN
  Administered 2023-09-19: 100 mg via INTRADERMAL

## 2023-09-19 MED ORDER — SODIUM CHLORIDE 0.9 % IV SOLN: INTRAVENOUS | Status: DC

## 2023-09-19 NOTE — Anesthesia Preprocedure Evaluation (Addendum)
 Anesthesia Evaluation  Patient identified by MRN, date of birth, ID band Patient awake    Reviewed: Allergy & Precautions, NPO status , Patient's Chart, lab work & pertinent test results  Airway Mallampati: II  TM Distance: >3 FB Neck ROM: full    Dental no notable dental hx.    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) No date: Ulcerative colitis  Past Surgical History: No date: none No date: TONSILLECTOMY     Reproductive/Obstetrics negative OB ROS                             Anesthesia Physical Anesthesia Plan  ASA: 2  Anesthesia Plan: General   Post-op Pain Management: Minimal or no pain anticipated   Induction: Intravenous  PONV Risk Score and Plan: 2 and Propofol infusion and TIVA  Airway Management Planned: Natural Airway and Nasal Cannula  Additional Equipment:   Intra-op Plan:   Post-operative Plan:   Informed Consent: I have reviewed the patients History and Physical, chart, labs and discussed the procedure including the risks, benefits and alternatives for the proposed anesthesia with the patient or authorized representative who has indicated his/her understanding and acceptance.     Dental Advisory Given  Plan Discussed with: Anesthesiologist, CRNA and Surgeon  Anesthesia Plan Comments: (Patient consented for risks of anesthesia including but not  limited to:  - adverse reactions to medications - risk of airway placement if required - damage to eyes, teeth, lips or other oral mucosa - nerve damage due to positioning  - sore throat or hoarseness - Damage to heart, brain, nerves, lungs, other parts of body or loss of life  Patient voiced understanding and assent.)       Anesthesia Quick Evaluation

## 2023-09-19 NOTE — Transfer of Care (Signed)
 Immediate Anesthesia Transfer of Care Note  Patient: Rhonda Sutton  Procedure(s) Performed: FLEXIBLE SIGMOIDOSCOPY BIOPSY  Patient Location: Endoscopy Unit  Anesthesia Type:General  Level of Consciousness: awake, alert , and oriented  Airway & Oxygen Therapy: Patient Spontanous Breathing  Post-op Assessment: Report given to RN and Post -op Vital signs reviewed and stable  Post vital signs: Reviewed and stable  Last Vitals:  Vitals Value Taken Time  BP 118/79 09/19/23 1246  Temp 35.9 1246  Pulse 84 09/19/23 1247  Resp 21 09/19/23 1247  SpO2 100 % 09/19/23 1247  Vitals shown include unfiled device data.  Last Pain:  Vitals:   09/19/23 1149  TempSrc: Oral         Complications: No notable events documented.

## 2023-09-19 NOTE — Interval H&P Note (Signed)
 History and Physical Interval Note:  09/19/2023 12:12 PM  Rhonda Sutton  has presented today for surgery, with the diagnosis of abnormal CT scan GI tract, Ulcerativcve proctitis with rectal bleeding.  The various methods of treatment have been discussed with the patient and family. After consideration of risks, benefits and other options for treatment, the patient has consented to  Procedure(s): FLEXIBLE SIGMOIDOSCOPY (N/A) as a surgical intervention.  The patient's history has been reviewed, patient examined, no change in status, stable for surgery.  I have reviewed the patient's chart and labs.  Questions were answered to the patient's satisfaction.     Regis Bill  Ok to proceed with flex sig

## 2023-09-19 NOTE — Op Note (Signed)
 [facility] Patient Name: Rhonda Sutton Procedure Date: 09/19/2023 11:31 AM MRN: [id number] Account #: [id number] Date of Birth: 10/12/1980 Admit Type: Outpatient Age: 43 Room: [facility] ENDO ROOM 3 Gender: Female Note Status: Finalized Instrument Name: Peds Colonoscope [id number] Procedure:             Flexible Sigmoidoscopy Indications:           Abnormal CT of the GI tract Providers:             Eather Colas MD, MD Referring MD:          No Local Md, MD (Referring MD) Medicines:             Monitored Anesthesia Care Complications:         No immediate complications. Estimated blood loss:                         Minimal. Procedure:             Pre-Anesthesia Assessment:                        - Prior to the procedure, a History and Physical was                         performed, and patient medications and allergies were                         reviewed. The patient is competent. The risks and                         benefits of the procedure and the sedation options and                         risks were discussed with the patient. All questions                         were answered and informed consent was obtained.                         Patient identification and proposed procedure were                         verified by the physician, the nurse, the                         anesthesiologist, the anesthetist and the technician                         in the endoscopy suite. Mental Status Examination:                         alert and oriented. Airway Examination: normal                         oropharyngeal airway and neck mobility. Respiratory                         Examination: clear to auscultation. CV Examination:  normal. Prophylactic Antibiotics: The patient does not                         require prophylactic antibiotics. Prior                         Anticoagulants: The patient has taken no anticoagulant                          or antiplatelet agents. ASA Grade Assessment: II - A                         patient with mild systemic disease. After reviewing                         the risks and benefits, the patient was deemed in                         satisfactory condition to undergo the procedure. The                         anesthesia plan was to use monitored anesthesia care                         (MAC). Immediately prior to administration of                         medications, the patient was re-assessed for adequacy                         to receive sedatives. The heart rate, respiratory                         rate, oxygen saturations, blood pressure, adequacy of                         pulmonary ventilation, and response to care were                         monitored throughout the procedure. The physical                         status of the patient was re-assessed after the                         procedure.                        After obtaining informed consent, the scope was passed                         under direct vision. The Colonoscope was introduced                         through the anus and advanced to the the left                         transverse colon. The flexible sigmoidoscopy was  accomplished without difficulty. The patient tolerated                         the procedure well. The quality of the bowel                         preparation was adequate to identify polyps. Findings:      The perianal and digital rectal examinations were normal.      The colon (entire examined portion) appeared normal. Biopsies were taken       with a cold forceps for histology. Estimated blood loss was minimal.      Internal hemorrhoids were found during retroflexion. The hemorrhoids       were Grade I (internal hemorrhoids that do not prolapse). Impression:            - The entire examined colon is normal. Biopsied.                        - Internal  hemorrhoids. Recommendation:        - Discharge patient to home.                        - Resume previous diet.                        - Await pathology results.                        - Return to referring physician as previously                         scheduled. Procedure Code(s):     --- Professional ---                        [phone], Sigmoidoscopy, flexible; with biopsy, single or                         multiple Diagnosis Code(s):     --- Professional ---                        K64.0, First degree hemorrhoids                        R93.3, Abnormal findings on diagnostic imaging of                         other parts of digestive tract CPT copyright 2022 [employer]. All rights reserved. The codes documented in this report are preliminary and upon coder review may  be revised to meet current compliance requirements. Eather Colas MD, MD 09/19/2023 12:46:16 PM Number of Addenda: 0 Note Initiated On: 09/19/2023 11:31 AM Total Procedure Duration: 0 hours 4 minutes 58 seconds  Estimated Blood Loss:  Estimated blood loss was minimal.      [facility]

## 2023-09-19 NOTE — H&P (Signed)
 Outpatient short stay form Pre-procedure 09/19/2023  Regis Bill, MD  Primary Physician: [facility]  Reason for visit:  Abnormal imaging  History of present illness:    43 y/o lady with history of ulcerative proctitis here for flex sig due to CT showing a lot of inflammation, possibly from worsening UC. No blood thinners. No family history of GI malignancies. History of appendectomy.    Current Facility-Administered Medications:    0.9 %  sodium chloride infusion, , Intravenous, Continuous, Yelitza Reach, Rossie Muskrat, MD, Last Rate: 20 mL/hr at 09/19/23 1159, New Bag at 09/19/23 1159  Medications Prior to Admission  Medication Sig Dispense Refill Last Dose/Taking   Calcium Carbonate Antacid (TUMS PO) Take 2 tablets by mouth daily as needed. For heartburn    Past Month   Prenat w/o A-FeCbn-DSS-FA-DHA (CITRANATAL HARMONY PO) Take 1 tablet by mouth daily.     Past Week   cephALEXin (KEFLEX) 500 MG capsule Take 1 capsule (500 mg total) by mouth 4 (four) times daily. 28 capsule 0    metroNIDAZOLE (FLAGYL) 500 MG tablet Take 1 tablet (500 mg total) by mouth 2 (two) times daily. 14 tablet 0    ondansetron (ZOFRAN-ODT) 4 MG disintegrating tablet Take 1 tablet (4 mg total) by mouth every 8 (eight) hours as needed for nausea or vomiting. 20 tablet 0    pantoprazole (PROTONIX) 40 MG tablet Take 40 mg by mouth at bedtime.        predniSONE (DELTASONE) 20 MG tablet Take 60 mg by mouth daily.          Allergies  Allergen Reactions   Codeine Itching     Past Medical History:  Diagnosis Date   Gastroesophageal reflux    Migraine    Pregnancy induced hypertension    Recurrent HSV (herpes simplex virus)    Ulcerative colitis     Review of systems:  Otherwise negative.    Physical Exam  Gen: Alert, oriented. Appears stated age.  HEENT: PERRLA. Lungs: No respiratory distress CV: RRR Abd: soft, benign, no masses Ext: No edema    Planned procedures: Proceed with flex sig.  The patient understands the nature of the planned procedure, indications, risks, alternatives and potential complications including but not limited to bleeding, infection, perforation, damage to internal organs and possible oversedation/side effects from anesthesia. The patient agrees and gives consent to proceed.  Please refer to procedure notes for findings, recommendations and patient disposition/instructions.     Regis Bill, MD [facility]

## 2023-09-19 NOTE — Anesthesia Postprocedure Evaluation (Signed)
 Anesthesia Post Note  Patient: GENNA CASIMIR  Procedure(s) Performed: FLEXIBLE SIGMOIDOSCOPY BIOPSY  Patient location during evaluation: Endoscopy Anesthesia Type: General Level of consciousness: awake and alert Pain management: pain level controlled Vital Signs Assessment: post-procedure vital signs reviewed and stable Respiratory status: spontaneous breathing, nonlabored ventilation, respiratory function stable and patient connected to nasal cannula oxygen Cardiovascular status: blood pressure returned to baseline and stable Postop Assessment: no apparent nausea or vomiting Anesthetic complications: no   No notable events documented.   Last Vitals:  Vitals:   09/19/23 1255 09/19/23 1305  BP:    Pulse: 75 83  Resp:  16  Temp:    SpO2: 99% 95%    Last Pain:  Vitals:   09/19/23 1305  TempSrc:   PainSc: 0-No pain                 Louie Boston

## 2023-09-23 LAB — SURGICAL PATHOLOGY
